# Patient Record
Sex: Male | Born: 1975 | Race: Black or African American | Hispanic: No | Marital: Married | Smoking: Current every day smoker
Health system: Southern US, Community
[De-identification: ages and names within clinical notes are randomized; demographics above are authoritative.]

## PROBLEM LIST (undated history)

## (undated) DIAGNOSIS — F32A Depression, unspecified: Secondary | ICD-10-CM

## (undated) DIAGNOSIS — I10 Essential (primary) hypertension: Secondary | ICD-10-CM

## (undated) DIAGNOSIS — M810 Age-related osteoporosis without current pathological fracture: Secondary | ICD-10-CM

## (undated) DIAGNOSIS — N289 Disorder of kidney and ureter, unspecified: Secondary | ICD-10-CM

## (undated) DIAGNOSIS — N19 Unspecified kidney failure: Secondary | ICD-10-CM

## (undated) DIAGNOSIS — K219 Gastro-esophageal reflux disease without esophagitis: Secondary | ICD-10-CM

## (undated) DIAGNOSIS — F329 Major depressive disorder, single episode, unspecified: Secondary | ICD-10-CM

## (undated) HISTORY — PX: NEPHRECTOMY TRANSPLANTED ORGAN: SUR880

## (undated) HISTORY — PX: COMBINED KIDNEY-PANCREAS TRANSPLANT: SHX1382

---

## 2008-03-30 ENCOUNTER — Inpatient Hospital Stay (HOSPITAL_COMMUNITY): Admission: EM | Admit: 2008-03-30 | Discharge: 2008-04-01 | Payer: Self-pay | Admitting: Internal Medicine

## 2008-03-30 ENCOUNTER — Ambulatory Visit: Payer: Self-pay | Admitting: Diagnostic Radiology

## 2008-03-30 ENCOUNTER — Encounter: Payer: Self-pay | Admitting: Emergency Medicine

## 2008-06-23 ENCOUNTER — Emergency Department (HOSPITAL_BASED_OUTPATIENT_CLINIC_OR_DEPARTMENT_OTHER): Admission: EM | Admit: 2008-06-23 | Discharge: 2008-06-23 | Payer: Self-pay | Admitting: Emergency Medicine

## 2008-08-25 ENCOUNTER — Emergency Department (HOSPITAL_BASED_OUTPATIENT_CLINIC_OR_DEPARTMENT_OTHER): Admission: EM | Admit: 2008-08-25 | Discharge: 2008-08-25 | Payer: Self-pay | Admitting: Emergency Medicine

## 2008-11-07 ENCOUNTER — Emergency Department (HOSPITAL_BASED_OUTPATIENT_CLINIC_OR_DEPARTMENT_OTHER): Admission: EM | Admit: 2008-11-07 | Discharge: 2008-11-07 | Payer: Self-pay | Admitting: Emergency Medicine

## 2008-11-07 ENCOUNTER — Ambulatory Visit: Payer: Self-pay | Admitting: Diagnostic Radiology

## 2009-02-18 ENCOUNTER — Ambulatory Visit: Payer: Self-pay | Admitting: Diagnostic Radiology

## 2009-02-18 ENCOUNTER — Emergency Department (HOSPITAL_BASED_OUTPATIENT_CLINIC_OR_DEPARTMENT_OTHER): Admission: EM | Admit: 2009-02-18 | Discharge: 2009-02-18 | Payer: Self-pay | Admitting: Emergency Medicine

## 2009-09-13 ENCOUNTER — Emergency Department (HOSPITAL_BASED_OUTPATIENT_CLINIC_OR_DEPARTMENT_OTHER): Admission: EM | Admit: 2009-09-13 | Discharge: 2009-09-13 | Payer: Self-pay | Admitting: Emergency Medicine

## 2009-09-13 ENCOUNTER — Ambulatory Visit: Payer: Self-pay | Admitting: Diagnostic Radiology

## 2010-04-25 LAB — URINALYSIS, ROUTINE W REFLEX MICROSCOPIC
Glucose, UA: NEGATIVE mg/dL
Ketones, ur: NEGATIVE mg/dL
Nitrite: NEGATIVE
Protein, ur: NEGATIVE mg/dL
pH: 5.5 (ref 5.0–8.0)

## 2010-04-25 LAB — DIFFERENTIAL
Basophils Absolute: 0.1 10*3/uL (ref 0.0–0.1)
Basophils Relative: 1 % (ref 0–1)
Lymphocytes Relative: 6 % — ABNORMAL LOW (ref 12–46)
Monocytes Absolute: 1.8 10*3/uL — ABNORMAL HIGH (ref 0.1–1.0)
Monocytes Relative: 11 % (ref 3–12)
Neutro Abs: 14.1 10*3/uL — ABNORMAL HIGH (ref 1.7–7.7)
Neutrophils Relative %: 81 % — ABNORMAL HIGH (ref 43–77)

## 2010-04-25 LAB — BASIC METABOLIC PANEL
CO2: 23 mEq/L (ref 19–32)
Chloride: 110 mEq/L (ref 96–112)
Creatinine, Ser: 1.6 mg/dL — ABNORMAL HIGH (ref 0.4–1.5)
GFR calc Af Amer: >60 mL/min (ref >60)
Potassium: 5 mEq/L (ref 3.5–5.1)

## 2010-04-25 LAB — CBC
HCT: 33 % — ABNORMAL LOW (ref 39.0–52.0)
Hemoglobin: 11 g/dL — ABNORMAL LOW (ref 13.0–17.0)
WBC: 17.4 10*3/uL — ABNORMAL HIGH (ref 4.0–10.5)

## 2010-05-22 ENCOUNTER — Emergency Department (INDEPENDENT_AMBULATORY_CARE_PROVIDER_SITE_OTHER): Payer: Medicare Other

## 2010-05-22 ENCOUNTER — Emergency Department (HOSPITAL_BASED_OUTPATIENT_CLINIC_OR_DEPARTMENT_OTHER)
Admission: EM | Admit: 2010-05-22 | Discharge: 2010-05-22 | Disposition: A | Discharge status: Home / Self Care | Payer: Medicare Other | Attending: Emergency Medicine | Admitting: Emergency Medicine

## 2010-05-22 DIAGNOSIS — R079 Chest pain, unspecified: Secondary | ICD-10-CM | POA: Insufficient documentation

## 2010-05-22 DIAGNOSIS — IMO0002 Reserved for concepts with insufficient information to code with codable children: Secondary | ICD-10-CM | POA: Insufficient documentation

## 2010-05-22 DIAGNOSIS — Y92009 Unspecified place in unspecified non-institutional (private) residence as the place of occurrence of the external cause: Secondary | ICD-10-CM | POA: Insufficient documentation

## 2010-05-22 DIAGNOSIS — X500XXA Overexertion from strenuous movement or load, initial encounter: Secondary | ICD-10-CM | POA: Insufficient documentation

## 2010-05-22 DIAGNOSIS — I10 Essential (primary) hypertension: Secondary | ICD-10-CM | POA: Insufficient documentation

## 2010-05-22 DIAGNOSIS — Z79899 Other long term (current) drug therapy: Secondary | ICD-10-CM | POA: Insufficient documentation

## 2010-05-22 DIAGNOSIS — G8929 Other chronic pain: Secondary | ICD-10-CM | POA: Insufficient documentation

## 2010-05-25 LAB — CBC
HCT: 36.1 % — ABNORMAL LOW (ref 39.0–52.0)
Hemoglobin: 10.7 g/dL — ABNORMAL LOW (ref 13.0–17.0)
Hemoglobin: 11.8 g/dL — ABNORMAL LOW (ref 13.0–17.0)
MCHC: 32.6 g/dL (ref 30.0–36.0)
RBC: 3.86 MIL/uL — ABNORMAL LOW (ref 4.22–5.81)
RDW: 14.6 % (ref 11.5–15.5)

## 2010-05-25 LAB — BASIC METABOLIC PANEL
BUN: 23 mg/dL (ref 6–23)
CO2: 19 mEq/L (ref 19–32)
Calcium: 7.8 mg/dL — ABNORMAL LOW (ref 8.4–10.5)
Chloride: 111 mEq/L (ref 96–112)
GFR calc Af Amer: >60 mL/min (ref >60)
GFR calc non Af Amer: 48 mL/min — ABNORMAL LOW (ref >60)
GFR calc non Af Amer: 58 mL/min — ABNORMAL LOW (ref >60)
Glucose, Bld: 111 mg/dL — ABNORMAL HIGH (ref 70–99)
Glucose, Bld: 87 mg/dL (ref 70–99)
Potassium: 4.2 mEq/L (ref 3.5–5.1)
Sodium: 136 mEq/L (ref 135–145)
Sodium: 138 mEq/L (ref 135–145)

## 2010-05-25 LAB — URINALYSIS, ROUTINE W REFLEX MICROSCOPIC
Bilirubin Urine: NEGATIVE
Nitrite: NEGATIVE
Specific Gravity, Urine: 1.022 (ref 1.005–1.030)
pH: 5 (ref 5.0–8.0)

## 2010-05-25 LAB — DIFFERENTIAL
Basophils Absolute: 0 10*3/uL (ref 0.0–0.1)
Basophils Absolute: 0.1 10*3/uL (ref 0.0–0.1)
Basophils Relative: 0 % (ref 0–1)
Basophils Relative: 1 % (ref 0–1)
Eosinophils Absolute: 0.2 10*3/uL (ref 0.0–0.7)
Eosinophils Relative: 1 % (ref 0–5)
Lymphocytes Relative: 1 % — ABNORMAL LOW (ref 12–46)
Lymphocytes Relative: 11 % — ABNORMAL LOW (ref 12–46)
Lymphs Abs: 0.2 10*3/uL — ABNORMAL LOW (ref 0.7–4.0)
Monocytes Absolute: 1 10*3/uL (ref 0.1–1.0)
Monocytes Absolute: 1.2 10*3/uL — ABNORMAL HIGH (ref 0.1–1.0)
Monocytes Relative: 15 % — ABNORMAL HIGH (ref 3–12)
Monocytes Relative: 9 % (ref 3–12)
Neutro Abs: 11.9 10*3/uL — ABNORMAL HIGH (ref 1.7–7.7)
Neutro Abs: 4.7 10*3/uL (ref 1.7–7.7)
Neutro Abs: 7.1 10*3/uL (ref 1.7–7.7)
Neutrophils Relative %: 86 % — ABNORMAL HIGH (ref 43–77)
Neutrophils Relative %: 88 % — ABNORMAL HIGH (ref 43–77)

## 2010-05-25 LAB — COMPREHENSIVE METABOLIC PANEL
BUN: 39 mg/dL — ABNORMAL HIGH (ref 6–23)
CO2: 24 mEq/L (ref 19–32)
Chloride: 108 mEq/L (ref 96–112)
Creatinine, Ser: 1.7 mg/dL — ABNORMAL HIGH (ref 0.4–1.5)
GFR calc non Af Amer: 47 mL/min — ABNORMAL LOW (ref >60)
Glucose, Bld: 170 mg/dL — ABNORMAL HIGH (ref 70–99)
Total Bilirubin: 0.7 mg/dL (ref 0.3–1.2)

## 2010-05-25 LAB — CULTURE, BLOOD (ROUTINE X 2)
Culture: NO GROWTH
Culture: NO GROWTH

## 2010-05-25 LAB — URINE MICROSCOPIC-ADD ON

## 2010-05-25 LAB — CLOSTRIDIUM DIFFICILE EIA

## 2010-05-25 LAB — STOOL CULTURE

## 2010-06-22 NOTE — H&P (Signed)
[name], [name]                   ACCOUNT NO.:  [id number]   MEDICAL RECORD NO.:  [id number]          PATIENT TYPE:  INP   LOCATION:  1523                         FACILITY:  [facility]   PHYSICIAN:  Della Goo, M.D. DATE OF BIRTH:  March 14, 1975   DATE OF ADMISSION:  03/31/2008  DATE OF DISCHARGE:  02/06/2008                              HISTORY & PHYSICAL   PRIMARY CARE PHYSICIAN:  Dr. Terie Purser, [place], [place].   CHIEF COMPLAINTS:  Nausea, vomiting and diarrhea.   HISTORY OF PRESENT ILLNESS:  This is a 35 year old male who presents to  the [facility] emergency department for evaluation  secondary to complaints of severe nausea, vomiting and diarrhea that  started in the a.m.  The patient reports these symptoms started all of a  sudden.  He denied having any fevers and chills at that time.  However,  when he did present to the emergency department he was found to have a  temperature of 103.2 rectally.  The patient denies having any  hematemesis, hematochezia or melena passage.  He denies that anyone else  is sick at home.  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   PAST MEDICAL HISTORY:  Otherwise:  1. History of renal failure in the past.  2. Pancreatitis in the past.  3. Previous type 1 diabetes, but no longer since his transplant.  4. Hypertension.   MEDICATIONS:  1. Labetalol 300 mg p.o. q.12 h.  2. Prednisone 7.5 mg one p.o. daily.  3. CellCept 250 mg three tablets p.o. b.i.d.  4. Tacrolimus 1 mg four tablets p.o. b.i.d.  5. Protonix 40 mg one p.o. daily.  6. Chantix 1 mg one p.o. b.i.d.   ALLERGIES:  AMOXICILLIN.   SOCIAL HISTORY:  The patient reports not smoking cigarettes since  March 12, 2008 and being on Chantix therapy.  He denies any alcohol  usage or any illicit drug usage.   FAMILY HISTORY:   Noncontributory.   REVIEW OF SYSTEMS:  Pertinents are mentioned above.   PHYSICAL EXAMINATION FINDINGS:  This is a 35 year old well-nourished,  well-developed male, currently in no acute distress.  VITAL SIGNS:  Temperature currently 101.7, blood pressure 111/73, heart  rate 99-111, respirations 24, but now 18; and O2 saturations 100% on  room air.  HEENT:  Normocephalic, atraumatic.  There is no scleral icterus.  Pupils  are equally round and reactive to light.  Extraocular movements are  intact.  Funduscopic benign.  Oropharynx is clear.  NECK:  Supple.  Full range of motion.  No thyromegaly, adenopathy or  jugular venous distention.  CARDIOVASCULAR:  Regular rate and rhythm.  No murmurs, gallops or rubs.  LUNGS:  Clear to auscultation bilaterally.  ABDOMEN:  Positive bowel sounds.  Soft, nontender, nondistended.  No  hepatosplenomegaly.  No rebound or guarding.  EXTREMITIES: Without cyanosis, clubbing or edema.  NEUROLOGIC EXAMINATION:  Alert and oriented x3.  There are no focal  deficits.  LABORATORY STUDIES:  White blood cell count 13.6, hemoglobin 14.7,  hematocrit 46.6, platelets 279, neutrophils 88% lymphocytes 1%.  Sodium  142, potassium 5.3, chloride 108, carbon dioxide 24, BUN 39, creatinine  1.7, glucose 170, lipase 59.  Urinalysis negative.  Chest x-ray:  No  acute disease process.   ASSESSMENT:  A 35 year old male being admitted with:  1. Acute gastroenteritis.  2. Mild dehydration.  3. Mild hyperglycemia.  4. History of renal and pancreatic transplant.   PLAN:  The patient will be admitted and placed on IV fluids for fluid  resuscitation.  The patient will be placed on antiemetic therapy as  needed for his symptoms.  Stool studies have been ordered to check for  culture, sensitivity and for C. difficile toxin; however at this time,  the patient probably has the winter GI virus; which is endemic at this  time.  The patient's regular medications will be continued,  and the  patient will be placed on DVT and GI prophylaxis as well.  Further  workup will ensue pending results of the patient's clinical course and  results of his studies.      Della Goo, M.D.  Electronically Signed     HJ/MEDQ  D:  03/31/2008  T:  03/31/2008  Job:  [id number]

## 2010-06-22 NOTE — Discharge Summary (Signed)
[name], [name]                   ACCOUNT NO.:  [id number]   MEDICAL RECORD NO.:  [id number]          PATIENT TYPE:  INP   LOCATION:  1523                         FACILITY:  [facility]   PHYSICIAN:  Monte Fantasia, MD  DATE OF BIRTH:  February 12, 1975   DATE OF ADMISSION:  03/30/2008  DATE OF DISCHARGE:  04/01/2008                               DISCHARGE SUMMARY   PRIMARY CARE PHYSICIAN:  Dr. Terie Purser in [place], [place].   DISCHARGE DIAGNOSES:  1. Acute gastroenteritis.  2. Mild dehydration.  3. History of renal and pancreatic transplant.  4. Hyperglycemia.   MEDICATIONS UPON DISCHARGE:  1. Labetalol 300 mg p.o. every 12 hours.  2. Prednisone 7.5 mg p.o. daily with food.  3. Tacrolimus 1 mg 4 tablets twice daily.  4. CellCept 250 mg 3 tablets twice daily.  5. Protonix 40 mg p.o. daily.  6. Chantix 1 mg p.o. b.i.d.   COURSE DURING THE HOSPITAL STAY:  A 35 year old Caucasian male patient  presented to [facility] Emergency Department for complaints of  nausea, vomiting, and diarrhea this a.m. 1 day back.  Patient stated  that the symptoms started all of a sudden.  Denied any complaints of  fever or chills at that time.  In the emergency department, patient was  found to have temperature of 103.2 rectally.  Patient does have history  of pancreatic and renal transplant performed in 2004 at [place] of  [place].  Patient was admitted in view of acute gastroenteritis.  On  admission, the creatinine was 1.7.  Patient was given IV fluid hydration  for the same.  Stool studies were sent.  C. diff toxins were negative.  Patient has tolerated p.o. intake well today and denies any episodes of  diarrhea.  At present, patient is stable to be discharged and can be  discharged home.  Patient is recommended to follow up with his primary  care physician in 1 week's time.   RADIOLOGICAL INVESTIGATIONS DONE DURING THE STAY IN THE HOSPITAL:  Chest  x-ray done on March 30, 2008.   Impression:  No active cardiopulmonary  disease.   LABS DONE DURING THE STAY IN THE HOSPITAL:  Total WBC 6.6, hemoglobin  10.7, hematocrit 33.0, platelets of 185, sodium 138, potassium 3.9,  chloride 114, bicarb 20, glucose 87, BUN 23, creatinine 1.4, total  bilirubin 0.7, alkaline phosphatase 63, AST is 38, ALT is 18, total  protein 8.5, albumin 5.0, calcium of 10.4.  UA has been negative.  Stool  C. diff toxin was negative.  Blood cultures have been no growth to date  and patient improved well.   DISPOSITION:  Patient is at present stable to be discharged today has  improved well with IV fluid hydration and is recommended to follow up  with the primary care physician.      Monte Fantasia, MD  Electronically Signed     MP/MEDQ  D:  04/01/2008  T:  04/01/2008  Job:  [id number]   cc:   Dr. Tina Griffiths, [place]

## 2010-12-22 DIAGNOSIS — E109 Type 1 diabetes mellitus without complications: Secondary | ICD-10-CM | POA: Insufficient documentation

## 2010-12-22 DIAGNOSIS — Z79899 Other long term (current) drug therapy: Secondary | ICD-10-CM | POA: Insufficient documentation

## 2010-12-22 DIAGNOSIS — I10 Essential (primary) hypertension: Secondary | ICD-10-CM | POA: Insufficient documentation

## 2010-12-22 DIAGNOSIS — Z940 Kidney transplant status: Secondary | ICD-10-CM | POA: Insufficient documentation

## 2010-12-22 DIAGNOSIS — Z9483 Pancreas transplant status: Secondary | ICD-10-CM | POA: Insufficient documentation

## 2010-12-22 DIAGNOSIS — D631 Anemia in chronic kidney disease: Secondary | ICD-10-CM | POA: Insufficient documentation

## 2011-08-30 ENCOUNTER — Encounter (HOSPITAL_BASED_OUTPATIENT_CLINIC_OR_DEPARTMENT_OTHER): Payer: Self-pay | Admitting: *Deleted

## 2011-08-30 ENCOUNTER — Emergency Department (HOSPITAL_BASED_OUTPATIENT_CLINIC_OR_DEPARTMENT_OTHER)
Admission: EM | Admit: 2011-08-30 | Discharge: 2011-08-30 | Disposition: A | Discharge status: Home / Self Care | Payer: Medicare Other | Attending: Emergency Medicine | Admitting: Emergency Medicine

## 2011-08-30 ENCOUNTER — Emergency Department (HOSPITAL_BASED_OUTPATIENT_CLINIC_OR_DEPARTMENT_OTHER): Payer: Medicare Other

## 2011-08-30 DIAGNOSIS — Y9354 Activity, bowling: Secondary | ICD-10-CM | POA: Insufficient documentation

## 2011-08-30 DIAGNOSIS — I10 Essential (primary) hypertension: Secondary | ICD-10-CM | POA: Insufficient documentation

## 2011-08-30 DIAGNOSIS — S93609A Unspecified sprain of unspecified foot, initial encounter: Secondary | ICD-10-CM | POA: Insufficient documentation

## 2011-08-30 DIAGNOSIS — M79609 Pain in unspecified limb: Secondary | ICD-10-CM | POA: Insufficient documentation

## 2011-08-30 DIAGNOSIS — F17200 Nicotine dependence, unspecified, uncomplicated: Secondary | ICD-10-CM | POA: Insufficient documentation

## 2011-08-30 DIAGNOSIS — K219 Gastro-esophageal reflux disease without esophagitis: Secondary | ICD-10-CM | POA: Insufficient documentation

## 2011-08-30 DIAGNOSIS — F3289 Other specified depressive episodes: Secondary | ICD-10-CM | POA: Insufficient documentation

## 2011-08-30 DIAGNOSIS — E119 Type 2 diabetes mellitus without complications: Secondary | ICD-10-CM | POA: Insufficient documentation

## 2011-08-30 DIAGNOSIS — X58XXXA Exposure to other specified factors, initial encounter: Secondary | ICD-10-CM | POA: Insufficient documentation

## 2011-08-30 DIAGNOSIS — F329 Major depressive disorder, single episode, unspecified: Secondary | ICD-10-CM | POA: Insufficient documentation

## 2011-08-30 DIAGNOSIS — Z79899 Other long term (current) drug therapy: Secondary | ICD-10-CM | POA: Insufficient documentation

## 2011-08-30 HISTORY — DX: Gastro-esophageal reflux disease without esophagitis: K21.9

## 2011-08-30 HISTORY — DX: Age-related osteoporosis without current pathological fracture: M81.0

## 2011-08-30 HISTORY — DX: Depression, unspecified: F32.A

## 2011-08-30 HISTORY — DX: Major depressive disorder, single episode, unspecified: F32.9

## 2011-08-30 HISTORY — DX: Disorder of kidney and ureter, unspecified: N28.9

## 2011-08-30 HISTORY — DX: Essential (primary) hypertension: I10

## 2011-08-30 NOTE — ED Provider Notes (Signed)
Medical screening examination/treatment/procedure(s) were performed by non-physician practitioner and as supervising physician I was immediately available for consultation/collaboration.  Cyndra Numbers, MD 08/30/11 [phone]

## 2011-08-30 NOTE — ED Notes (Signed)
pain in his right foot x 1 week. Hx of osteoporosis.  No known injury.

## 2011-08-30 NOTE — ED Provider Notes (Signed)
History     CSN: [id number]  Arrival date & time 08/30/11  1801   First MD Initiated Contact with Patient 08/30/11 1837      Chief Complaint  Patient presents with  . Foot Pain    (Consider location/radiation/quality/duration/timing/severity/associated sxs/prior treatment) HPI Comments: 36 y/o male presents with right foot pain x 1 week. States he was bowling 1 week ago and believes he may have stepped on it the wrong way. Pain increased over the past week due to working on his feet all week. Pain described as throbbing 6/10 at rest and sharp 10/10 with any pressure. Has not tried any alleviating factors. He noticed some swelling over the past few days. Admits he is osteopenic due to chronic steroid use post kidney and pancreas transplant. He does take a daily calcium supplement. Denies any ankle pain, numbness or tingling in foot.  Patient is a 36 y.o. male presenting with lower extremity pain. The history is provided by the patient and a significant other.  Foot Pain Pertinent negatives include no numbness.    Past Medical History  Diagnosis Date  . Osteoporosis   . Renal disorder   . Diabetes mellitus   . Hypertension   . GERD (gastroesophageal reflux disease)   . Depression     Past Surgical History  Procedure Date  . Nephrectomy transplanted organ   . Combined kidney-pancreas transplant     No family history on file.  History  Substance Use Topics  . Smoking status: Current Everyday Smoker -- 0.5 packs/day  . Smokeless tobacco: Not on file  . Alcohol Use: No      Review of Systems  Musculoskeletal:       Positive right foot pain and swelling  Skin: Negative for color change.  Neurological: Negative for numbness.    Allergies  Review of patient's allergies indicates no known allergies.  Home Medications   Current Outpatient Rx  Name Route Sig Dispense Refill  . ALBUTEROL SULFATE HFA 108 (90 BASE) MCG/ACT IN AERS Inhalation Inhale 2 puffs into the  lungs every 4 (four) hours as needed. For shortness of breath    . CALCIUM-VITAMIN D PO Oral Take 1 tablet by mouth daily.    . OMEGA-3 FATTY ACIDS 1000 MG PO CAPS Oral Take 1 g by mouth daily.    Marland Kitchen HAWTHORN PO Oral Take 1 tablet by mouth daily.    Marland Kitchen LABETALOL HCL 200 MG PO TABS Oral Take 200 mg by mouth 2 (two) times daily.    Marland Kitchen MILK THISTLE PO Oral Take 1 tablet by mouth daily.    . ADULT MULTIVITAMIN W/MINERALS CH Oral Take 1 tablet by mouth daily.    Marland Kitchen MYCOPHENOLATE MOFETIL 250 MG PO CAPS Oral Take 750 mg by mouth 2 (two) times daily.    Marland Kitchen OMEPRAZOLE 40 MG PO CPDR Oral Take 40 mg by mouth 2 (two) times daily.    Marland Kitchen PREDNISONE (PAK) 5 MG PO TABS Oral Take 7.5 mg by mouth daily.    . SULFAMETHOXAZOLE-TMP DS 800-160 MG PO TABS Oral Take 1 tablet by mouth 2 (two) times a week. On Mon and Thurs    . TACROLIMUS 1 MG PO CAPS Oral Take 3 mg by mouth 2 (two) times daily.      BP 141/76  Pulse 72  Temp 98.2 F (36.8 C) (Oral)  Resp 20  SpO2 99%  Physical Exam  Constitutional: He is oriented to person, place, and time. He appears well-developed and well-nourished.  No distress.  HENT:  Head: Normocephalic and atraumatic.  Eyes: Conjunctivae are normal.  Cardiovascular: Normal rate, regular rhythm, normal heart sounds and intact distal pulses.   Pulmonary/Chest: Effort normal and breath sounds normal.  Musculoskeletal:       Right ankle: Normal. Achilles tendon normal.       Right foot: He exhibits decreased range of motion, tenderness (over 5th metatarsal most prominent at the base), bony tenderness and swelling (over lateral aspect of right foot). He exhibits normal capillary refill and no deformity.  Neurological: He is alert and oriented to person, place, and time. No sensory deficit.  Skin: Skin is warm.  Psychiatric: He has a normal mood and affect. His behavior is normal.    ED Course  Procedures (including critical care time)  Labs Reviewed - No data to display Dg Foot Complete  Right  08/30/2011  *RADIOLOGY REPORT*  Clinical Data: Right foot pain along the fourth and fifth metatarsals diabetes, renal failure  RIGHT FOOT COMPLETE - 3+ VIEW  Comparison: None.  Findings: Bones appear osteopenic for the patient age.  Peripheral vascular calcifications noted throughout the foot.  Normal alignment.  No fracture evident.  No evidence of focal bone loss, bony destruction, periostitis.  No significant arthropathy or degenerative process.  No radiopaque foreign body.  IMPRESSION: No acute osseous finding.  Peripheral atherosclerosis  Original Report Authenticated By: Judie Petit. Ruel Favors, M.D.     1. Foot sprain       MDM  36 y/o male with 1 week of foot pain s/p bowling. xrays negative for acute fracture. No evidence of neurovascular compromise. Conservative tx discussed- RICE. Post-op shoe and crutches given. Patient states he has 800 mg ibuprofen at home and will take that. Follow up with ortho.        Trevor Mace, PA-C 08/30/11 1922

## 2011-08-30 NOTE — ED Notes (Signed)
Pt stated that he went bowling a week ago and ever since his foot has hurt and been swollen. Pulses are present bilaterally, +2. Pt is ambulatory with steady gait. NAD noted.

## 2011-11-11 ENCOUNTER — Emergency Department (HOSPITAL_BASED_OUTPATIENT_CLINIC_OR_DEPARTMENT_OTHER)
Admission: EM | Admit: 2011-11-11 | Discharge: 2011-11-11 | Disposition: A | Discharge status: Home / Self Care | Payer: No Typology Code available for payment source | Attending: Emergency Medicine | Admitting: Emergency Medicine

## 2011-11-11 ENCOUNTER — Emergency Department (HOSPITAL_BASED_OUTPATIENT_CLINIC_OR_DEPARTMENT_OTHER): Payer: No Typology Code available for payment source

## 2011-11-11 ENCOUNTER — Encounter (HOSPITAL_BASED_OUTPATIENT_CLINIC_OR_DEPARTMENT_OTHER): Payer: Self-pay | Admitting: *Deleted

## 2011-11-11 DIAGNOSIS — N289 Disorder of kidney and ureter, unspecified: Secondary | ICD-10-CM | POA: Insufficient documentation

## 2011-11-11 DIAGNOSIS — K219 Gastro-esophageal reflux disease without esophagitis: Secondary | ICD-10-CM | POA: Insufficient documentation

## 2011-11-11 DIAGNOSIS — Z043 Encounter for examination and observation following other accident: Secondary | ICD-10-CM | POA: Insufficient documentation

## 2011-11-11 DIAGNOSIS — M810 Age-related osteoporosis without current pathological fracture: Secondary | ICD-10-CM | POA: Insufficient documentation

## 2011-11-11 DIAGNOSIS — E119 Type 2 diabetes mellitus without complications: Secondary | ICD-10-CM | POA: Insufficient documentation

## 2011-11-11 DIAGNOSIS — M542 Cervicalgia: Secondary | ICD-10-CM

## 2011-11-11 DIAGNOSIS — I10 Essential (primary) hypertension: Secondary | ICD-10-CM | POA: Insufficient documentation

## 2011-11-11 DIAGNOSIS — R51 Headache: Secondary | ICD-10-CM

## 2011-11-11 DIAGNOSIS — F17200 Nicotine dependence, unspecified, uncomplicated: Secondary | ICD-10-CM | POA: Insufficient documentation

## 2011-11-11 NOTE — ED Notes (Signed)
MD at bedside. 
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
## 2011-11-11 NOTE — ED Notes (Signed)
Patient was brought to Cat Scan.  Pt had large earring in left ear.  When I advised pt that earring needed to come out Pt stated "it doesn't come out"  I advised pt that I could not do the Cat scan of his neck because of that earring.  Pt turned around and walked back to the ED.  Pt advised Dr Rosalia Hammers that no one offered to help him take his earring out.  When I went to get something to help remove his earring the patient talked to Dr Rosalia Hammers and left AMA.

## 2011-11-11 NOTE — ED Notes (Signed)
Dr ray into room , pt upset about the wait time and unable to get earring out for ct scan, pt took c collor off and told MD he was leaving

## 2011-11-13 ENCOUNTER — Emergency Department (HOSPITAL_BASED_OUTPATIENT_CLINIC_OR_DEPARTMENT_OTHER)
Admission: EM | Admit: 2011-11-13 | Discharge: 2011-11-13 | Disposition: A | Discharge status: Home / Self Care | Payer: No Typology Code available for payment source | Attending: Emergency Medicine | Admitting: Emergency Medicine

## 2011-11-13 ENCOUNTER — Encounter (HOSPITAL_BASED_OUTPATIENT_CLINIC_OR_DEPARTMENT_OTHER): Payer: Self-pay | Admitting: *Deleted

## 2011-11-13 DIAGNOSIS — M542 Cervicalgia: Secondary | ICD-10-CM | POA: Insufficient documentation

## 2011-11-13 DIAGNOSIS — R51 Headache: Secondary | ICD-10-CM | POA: Insufficient documentation
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
## 2011-11-19 ENCOUNTER — Emergency Department (HOSPITAL_BASED_OUTPATIENT_CLINIC_OR_DEPARTMENT_OTHER): Payer: No Typology Code available for payment source

## 2011-11-19 ENCOUNTER — Encounter (HOSPITAL_BASED_OUTPATIENT_CLINIC_OR_DEPARTMENT_OTHER): Payer: Self-pay | Admitting: Emergency Medicine

## 2011-11-19 ENCOUNTER — Emergency Department (HOSPITAL_BASED_OUTPATIENT_CLINIC_OR_DEPARTMENT_OTHER)
Admission: EM | Admit: 2011-11-19 | Discharge: 2011-11-19 | Disposition: A | Discharge status: Home / Self Care | Payer: No Typology Code available for payment source | Attending: Emergency Medicine | Admitting: Emergency Medicine

## 2011-11-19 DIAGNOSIS — Z79899 Other long term (current) drug therapy: Secondary | ICD-10-CM | POA: Insufficient documentation

## 2011-11-19 DIAGNOSIS — S139XXA Sprain of joints and ligaments of unspecified parts of neck, initial encounter: Secondary | ICD-10-CM | POA: Insufficient documentation

## 2011-11-19 DIAGNOSIS — I10 Essential (primary) hypertension: Secondary | ICD-10-CM | POA: Insufficient documentation

## 2011-11-19 DIAGNOSIS — E119 Type 2 diabetes mellitus without complications: Secondary | ICD-10-CM | POA: Insufficient documentation

## 2011-11-19 DIAGNOSIS — M542 Cervicalgia: Secondary | ICD-10-CM | POA: Insufficient documentation

## 2011-11-19 DIAGNOSIS — Y92410 Unspecified street and highway as the place of occurrence of the external cause: Secondary | ICD-10-CM | POA: Insufficient documentation

## 2011-11-19 DIAGNOSIS — R51 Headache: Secondary | ICD-10-CM | POA: Insufficient documentation

## 2011-11-19 DIAGNOSIS — S161XXA Strain of muscle, fascia and tendon at neck level, initial encounter: Secondary | ICD-10-CM

## 2011-11-19 MED ORDER — CYCLOBENZAPRINE HCL 10 MG PO TABS: 10.0000 mg | ORAL_TABLET | Freq: Three times a day (TID) | ORAL | Status: DC | PRN
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
## 2011-11-23 NOTE — ED Provider Notes (Signed)
Medical screening examination/treatment/procedure(s) were performed by non-physician practitioner and as supervising physician I was immediately available for consultation/collaboration.    Daisi Kentner L Juna Caban, MD 11/23/11 1154 

## 2013-10-21 ENCOUNTER — Emergency Department (HOSPITAL_BASED_OUTPATIENT_CLINIC_OR_DEPARTMENT_OTHER)
Admission: EM | Admit: 2013-10-21 | Discharge: 2013-10-21 | Disposition: A | Discharge status: Home / Self Care | Payer: Medicare Other | Attending: Emergency Medicine | Admitting: Emergency Medicine

## 2013-10-21 ENCOUNTER — Encounter (HOSPITAL_BASED_OUTPATIENT_CLINIC_OR_DEPARTMENT_OTHER): Payer: Self-pay | Admitting: Emergency Medicine

## 2013-10-21 DIAGNOSIS — Z87891 Personal history of nicotine dependence: Secondary | ICD-10-CM | POA: Diagnosis not present

## 2013-10-21 DIAGNOSIS — M545 Low back pain, unspecified: Secondary | ICD-10-CM | POA: Diagnosis not present

## 2013-10-21 DIAGNOSIS — M5380 Other specified dorsopathies, site unspecified: Secondary | ICD-10-CM | POA: Insufficient documentation

## 2013-10-21 DIAGNOSIS — Z87448 Personal history of other diseases of urinary system: Secondary | ICD-10-CM | POA: Diagnosis not present

## 2013-10-21 DIAGNOSIS — E119 Type 2 diabetes mellitus without complications: Secondary | ICD-10-CM | POA: Diagnosis not present

## 2013-10-21 DIAGNOSIS — M810 Age-related osteoporosis without current pathological fracture: Secondary | ICD-10-CM | POA: Diagnosis not present

## 2013-10-21 DIAGNOSIS — Z79899 Other long term (current) drug therapy: Secondary | ICD-10-CM | POA: Diagnosis not present

## 2013-10-21 DIAGNOSIS — Z791 Long term (current) use of non-steroidal anti-inflammatories (NSAID): Secondary | ICD-10-CM | POA: Insufficient documentation

## 2013-10-21 DIAGNOSIS — Z8719 Personal history of other diseases of the digestive system: Secondary | ICD-10-CM | POA: Diagnosis not present

## 2013-10-21 DIAGNOSIS — Z880 Allergy status to penicillin: Secondary | ICD-10-CM | POA: Insufficient documentation

## 2013-10-21 DIAGNOSIS — I10 Essential (primary) hypertension: Secondary | ICD-10-CM | POA: Insufficient documentation

## 2013-10-21 DIAGNOSIS — Z8659 Personal history of other mental and behavioral disorders: Secondary | ICD-10-CM | POA: Diagnosis not present

## 2013-10-21 DIAGNOSIS — M62830 Muscle spasm of back: Secondary | ICD-10-CM

## 2013-10-21 DIAGNOSIS — IMO0002 Reserved for concepts with insufficient information to code with codable children: Secondary | ICD-10-CM | POA: Insufficient documentation

## 2013-10-21 MED ORDER — HYDROCODONE-ACETAMINOPHEN 5-325 MG PO TABS: 1.0000 | ORAL_TABLET | Freq: Four times a day (QID) | ORAL | Status: DC | PRN

## 2013-10-21 MED ORDER — CYCLOBENZAPRINE HCL 10 MG PO TABS: 10.0000 mg | ORAL_TABLET | Freq: Three times a day (TID) | ORAL | Status: DC | PRN

## 2013-10-21 MED ORDER — NAPROXEN 500 MG PO TABS: 500.0000 mg | ORAL_TABLET | Freq: Two times a day (BID) | ORAL | Status: DC | PRN

## 2013-10-21 NOTE — ED Provider Notes (Signed)
CSN: [id number]  Arrival date & time 10/21/13  1619 History   First MD Initiated Contact with Patient 10/21/13 1624     Chief Complaint  Patient presents with  . Back Pain     (Consider location/radiation/quality/duration/timing/severity/associated sxs/prior Treatment) HPI Comments: [name] is a 38 y.o. male with a PMHx of osteoporosis, kidney transplant, DM, HTN, GERD, and depression, presenting to the ED today with complaints of lumbar back pain that began gradually 2 weeks ago after visiting a water park, as well as installing hardwood floors. His pain at 8/10, describes it as throbbing, constant, nonradiating, worse with standing or bending and after long periods of activity, and improved with heat. He has not tried any medications for pain, has used hydrocodone with in the past. He does endorse chronic back pain, but states this is slightly different than his chronic back pain. Denies any fevers, cough, chest pain, shortness of breath, abdominal pain, nausea, vomiting, diarrhea, incontinence of urine or stool, perianal numbness or tingling, lower extremity weakness or numbness, dysuria, hematuria, or cauda equina symptoms. No recent trauma or injury to his back.  Patient is a 38 y.o. male presenting with back pain. The history is provided by the patient. No language interpreter was used.  Back Pain Location:  Lumbar spine Quality:  Cramping (throbbing) Radiates to:  Does not radiate Pain severity:  Moderate (8/10) Onset quality:  Gradual Duration:  2 weeks Timing:  Constant Progression:  Unchanged Chronicity:  Recurrent Context: physical stress   Context comment:  Went to a water park 2 wks ago, and installing hard wood floors recently Relieved by:  Heating pad Worsened by:  Bending and standing Ineffective treatments:  None tried Associated symptoms: no abdominal pain, no bladder incontinence, no bowel incontinence, no chest pain, no dysuria, no fever, no headaches, no leg  pain, no numbness, no paresthesias, no pelvic pain, no perianal numbness, no tingling, no weakness and no weight loss   Risk factors: no hx of cancer     Past Medical History  Diagnosis Date  . Osteoporosis   . Renal disorder   . Diabetes mellitus   . Hypertension   . GERD (gastroesophageal reflux disease)   . Depression    Past Surgical History  Procedure Laterality Date  . Nephrectomy transplanted organ    . Combined kidney-pancreas transplant     No family history on file. History  Substance Use Topics  . Smoking status: Former Smoker -- 0.50 packs/day  . Smokeless tobacco: Not on file  . Alcohol Use: No    Review of Systems  Constitutional: Negative for fever, chills and weight loss.  Respiratory: Negative for cough and shortness of breath.   Cardiovascular: Negative for chest pain.  Gastrointestinal: Negative for nausea, vomiting, abdominal pain, diarrhea, constipation and bowel incontinence.  Genitourinary: Negative for bladder incontinence, dysuria, urgency, frequency, hematuria, flank pain, difficulty urinating and pelvic pain.  Musculoskeletal: Positive for back pain. Negative for arthralgias, gait problem, joint swelling, myalgias, neck pain and neck stiffness.  Skin: Negative for color change.  Neurological: Negative for dizziness, tingling, weakness, numbness, headaches and paresthesias.  10 Systems reviewed and are negative for acute change except as noted in the HPI.     Allergies  Penicillins  Home Medications   Prior to Admission medications   Medication Sig Start Date End Date Taking? Authorizing Provider  albuterol (PROVENTIL HFA;VENTOLIN HFA) 108 (90 BASE) MCG/ACT inhaler Inhale 2 puffs into the lungs every 4 (four) hours as  needed. For shortness of breath    Historical Provider, MD  CALCIUM-VITAMIN D PO Take 1 tablet by mouth daily.    Historical Provider, MD  cyclobenzaprine (FLEXERIL) 10 MG tablet Take 1 tablet (10 mg total) by mouth 3 (three)  times daily as needed for muscle spasms. 11/19/11   Kaitlyn Szekalski, PA-C  cyclobenzaprine (FLEXERIL) 10 MG tablet Take 1 tablet (10 mg total) by mouth 3 (three) times daily as needed for muscle spasms. 10/21/13   Asjah Rauda Strupp Camprubi-Soms, PA-C  esomeprazole (NEXIUM) 20 MG capsule Take 20 mg by mouth daily before breakfast.    Historical Provider, MD  fish oil-omega-3 fatty acids 1000 MG capsule Take 1 g by mouth daily.    Historical Provider, MD  HYDROcodone-acetaminophen (NORCO) 5-325 MG per tablet Take 1-2 tablets by mouth every 6 (six) hours as needed for severe pain. 10/21/13   Hussain Maimone Strupp Camprubi-Soms, PA-C  labetalol (NORMODYNE) 200 MG tablet Take 200 mg by mouth 2 (two) times daily.    Historical Provider, MD  MILK THISTLE PO Take 1 tablet by mouth daily.    Historical Provider, MD  Multiple Vitamin (MULTIVITAMIN WITH MINERALS) TABS Take 1 tablet by mouth daily.    Historical Provider, MD  mycophenolate (CELLCEPT) 250 MG capsule Take 750 mg by mouth 2 (two) times daily.    Historical Provider, MD  naproxen (NAPROSYN) 500 MG tablet Take 1 tablet (500 mg total) by mouth 2 (two) times daily as needed for mild pain, moderate pain or headache (TAKE WITH MEALS.). 10/21/13   Vaani Morren Strupp Camprubi-Soms, PA-C  predniSONE (STERAPRED UNI-PAK) 5 MG TABS Take 7.5 mg by mouth daily.    Historical Provider, MD  sulfamethoxazole-trimethoprim (BACTRIM DS) 800-160 MG per tablet Take 1 tablet by mouth 2 (two) times a week. On Mon and Thurs    Historical Provider, MD  tacrolimus (PROGRAF) 1 MG capsule Take 3 mg by mouth 2 (two) times daily.    Historical Provider, MD   BP 127/79  Pulse 67  Temp(Src) 98.2 F (36.8 C) (Oral)  Resp 14  Ht  (1.651 m)  Wt 130 lb (58.968 kg)  BMI 21.63 kg/m2  SpO2 100% Physical Exam  Nursing note and vitals reviewed. Constitutional: He is oriented to person, place, and time. Vital signs are normal. He appears well-developed and well-nourished. No distress.    Afebrile, NAD  HENT:  Head: Normocephalic and atraumatic.  Mouth/Throat: Mucous membranes are normal.  Eyes: Conjunctivae and EOM are normal. Right eye exhibits no discharge. Left eye exhibits no discharge.  Neck: Normal range of motion. Neck supple. No spinous process tenderness and no muscular tenderness present. No rigidity. Normal range of motion present.  FROM intact without spinous process or paraspinous muscle TTP, no bony stepoffs or deformities, no muscle spasms. No rigidity or meningeal signs. No bruising or swelling.  Cardiovascular: Normal rate and intact distal pulses.   Pulmonary/Chest: Effort normal. No respiratory distress.  Abdominal: Soft. Normal appearance. He exhibits no distension. There is no tenderness. There is no rigidity, no rebound, no guarding and no CVA tenderness.  Musculoskeletal: Normal range of motion.       Lumbar back: He exhibits pain and spasm. He exhibits normal range of motion, no bony tenderness, no swelling and no deformity.       Back:  Bilateral lumbar paraspinous muscle TTP and mild spasm noted, no midline TTP or bony deformities. Neg straight leg raise bilaterally. FROM intact in all spinal levels. Gait nonantalgic. Strength 5/5 in  all extremities. Sensation grossly intact in all extremities.   Neurological: He is alert and oriented to person, place, and time. He has normal strength. No sensory deficit. Gait normal.  Skin: Skin is warm, dry and intact. No rash noted.  Psychiatric: He has a normal mood and affect.    ED Course  Procedures (including critical care time) Labs Review Labs Reviewed - No data to display  Imaging Review No results found.   EKG Interpretation None      MDM   Final diagnoses:  Bilateral low back pain without sciatica  Muscle spasm of back    38y/o male with acute on chronic back pain after recent physical stress. No red flag s/s of low back pain. No s/s of central cord compression or cauda equina. Lower  extremities are neurovascularly intact and patient is ambulating without difficulty. Doubt any kidney related issue, no need for urgent imaging or labs at this time.   Patient was counseled on back pain precautions and told to do activity as tolerated but do not lift, push, or pull heavy objects more than 10 pounds for the next week. Patient counseled to use ice or heat on back for no longer than 15 minutes every hour.   Rx given for muscle relaxer and counseled on proper use of muscle relaxant medication. Rx given for narcotic pain medicine and counseled on proper use of narcotic pain medications. Told that they can increase to every 4 hrs if needed while pain is worse. Counseled not to combine this medication with others containing tylenol. Urged patient not to drink alcohol, drive, or perform any other activities that requires focus while taking either of these medications.   Patient urged to follow-up with PCP if pain does not improve with treatment and rest or if pain becomes recurrent. Urged to return with worsening severe pain, loss of bowel or bladder control, trouble walking. The patient verbalizes understanding and agrees with the plan.  Meds ordered this encounter  Medications  . HYDROcodone-acetaminophen (NORCO) 5-325 MG per tablet    Sig: Take 1-2 tablets by mouth every 6 (six) hours as needed for severe pain.    Dispense:  6 tablet    Refill:  0    Order Specific Question:  Supervising Provider    Answer:  Eber Hong D [[id number]]  . naproxen (NAPROSYN) 500 MG tablet    Sig: Take 1 tablet (500 mg total) by mouth 2 (two) times daily as needed for mild pain, moderate pain or headache (TAKE WITH MEALS.).    Dispense:  10 tablet    Refill:  0    Order Specific Question:  Supervising Provider    Answer:  Eber Hong D [[id number]]  . cyclobenzaprine (FLEXERIL) 10 MG tablet    Sig: Take 1 tablet (10 mg total) by mouth 3 (three) times daily as needed for muscle spasms.    Dispense:  10  tablet    Refill:  0    Order Specific Question:  Supervising Provider    Answer:  Vida Roller [address] Gintz Camprubi-Soms, PA-C 10/21/13 1732

## 2013-10-21 NOTE — Discharge Instructions (Signed)
Back Pain:  Your back pain should be treated with medicines such as ibuprofen or aleve and this back pain should get better over the next 2 weeks.  However if you develop severe or worsening pain, low back pain with fever, numbness, weakness or inability to walk or urinate, you should return to the ER immediately.  Please follow up with your doctor this week for a recheck if still having symptoms.  Low back pain is discomfort in the lower back that may be due to injuries to muscles and ligaments around the spine.  Occasionally, it may be caused by a a problem to a part of the spine called a disc.  The pain may last several days or a week;  However, most patients get completely well in 4 weeks.  Self - care:  The application of heat can help soothe the pain.  Maintaining your daily activities, including walking, is encourged, as it will help you get better faster than just staying in bed. Perform gentle stretching as discussed. Drink plenty of fluids.  Medications are also useful to help with pain control.  A commonly prescribed medications includes Norco.  Don't drive while taking this medication, and don't use any additional tylenol.  Non steroidal anti inflammatory medications including Ibuprofen and naproxen;  These medications help both pain and swelling and are very useful in treating back pain.  They should be taken with food, as they can cause stomach upset, and more seriously, stomach bleeding.    Muscle relaxants:  These medications can help with muscle tightness that is a cause of lower back pain.  Most of these medications can cause drowsiness, and it is not safe to drive or use dangerous machinery while taking them.  SEEK IMMEDIATE MEDICAL ATTENTION IF: New numbness, tingling, weakness, or problem with the use of your arms or legs.  Severe back pain not relieved with medications.  Difficulty with or loss of control of your bowel or bladder control.  Increasing pain in any areas of the  body (such as chest or abdominal pain).  Shortness of breath, dizziness or fainting.  Nausea (feeling sick to your stomach), vomiting, fever, or sweats.  You will need to follow up with  Your primary healthcare provider in 1-2 weeks for reassessment.    Back Pain, Adult Back pain is very common. The pain often gets better over time. The cause of back pain is usually not dangerous. Most people can learn to manage their back pain on their own.  HOME CARE   Stay active. Start with short walks on flat ground if you can. Try to walk farther each day.  Do not sit, drive, or stand in one place for more than 30 minutes. Do not stay in bed.  Do not avoid exercise or work. Activity can help your back heal faster.  Be careful when you bend or lift an object. Bend at your knees, keep the object close to you, and do not twist.  Sleep on a firm mattress. Lie on your side, and bend your knees. If you lie on your back, put a pillow under your knees.  Only take medicines as told by your doctor.  Put ice on the injured area.  Put ice in a plastic bag.  Place a towel between your skin and the bag.  Leave the ice on for 15-20 minutes, 03-04 times a day for the first 2 to 3 days. After that, you can switch between ice and heat packs.  Ask  your doctor about back exercises or massage.  Avoid feeling anxious or stressed. Find good ways to deal with stress, such as exercise. GET HELP RIGHT AWAY IF:   Your pain does not go away with rest or medicine.  Your pain does not go away in 1 week.  You have new problems.  You do not feel well.  The pain spreads into your legs.  You cannot control when you poop (bowel movement) or pee (urinate).  Your arms or legs feel weak or lose feeling (numbness).  You feel sick to your stomach (nauseous) or throw up (vomit).  You have belly (abdominal) pain.  You feel like you may pass out (faint). MAKE SURE YOU:   Understand these instructions.  Will  watch your condition.  Will get help right away if you are not doing well or get worse. Document Released: 07/13/2007 Document Revised: 04/18/2011 Document Reviewed: 05/28/2013 [facility] Patient Information 2015 [employer], [place]. This information is not intended to replace advice given to you by your health care provider. Make sure you discuss any questions you have with your health care provider.  Back Exercises Back exercises help treat and prevent back injuries. The goal is to increase your strength in your belly (abdominal) and back muscles. These exercises can also help with flexibility. Start these exercises when told by your doctor. HOME CARE Back exercises include: Pelvic Tilt.  Lie on your back with your knees bent. Tilt your pelvis until the lower part of your back is against the floor. Hold this position 5 to 10 sec. Repeat this exercise 5 to 10 times. Knee to Chest.  Pull 1 knee up against your chest and hold for 20 to 30 seconds. Repeat this with the other knee. This may be done with the other leg straight or bent, whichever feels better. Then, pull both knees up against your chest. Sit-Ups or Curl-Ups.  Bend your knees 90 degrees. Start with tilting your pelvis, and do a partial, slow sit-up. Only lift your upper half 30 to 45 degrees off the floor. Take at least 2 to 3 seonds for each sit-up. Do not do sit-ups with your knees out straight. If partial sit-ups are difficult, simply do the above but with only tightening your belly (abdominal) muscles and holding it as told. Hip-Lift.  Lie on your back with your knees flexed 90 degrees. Push down with your feet and shoulders as you raise your hips 2 inches off the floor. Hold for 10 seconds, repeat 5 to 10 times. Back Arches.  Lie on your stomach. Prop yourself up on bent elbows. Slowly press on your hands, causing an arch in your low back. Repeat 3 to 5 times. Shoulder-Lifts.  Lie face down with arms beside your body. Keep hips  and belly pressed to floor as you slowly lift your head and shoulders off the floor. Do not overdo your exercises. Be careful in the beginning. Exercises may cause you some mild back discomfort. If the pain lasts for more than 15 minutes, stop the exercises until you see your doctor. Improvement with exercise for back problems is slow.  Document Released: 02/26/2010 Document Revised: 04/18/2011 Document Reviewed: 11/25/2010 [facility] Patient Information 2015 [place], [place]. This information is not intended to replace advice given to you by your health care provider. Make sure you discuss any questions you have with your health care provider.  Heat Therapy Heat therapy can help make painful, stiff muscles and joints feel better. Do not use heat on new  injuries. Wait at least 48 hours after an injury to use heat. Do not use heat when you have aches or pains right after an activity. If you still have pain 3 hours after stopping the activity, then you may use heat. HOME CARE Wet heat pack  Soak a clean towel in warm water. Squeeze out the extra water.  Put the warm, wet towel in a plastic bag.  Place a thin, dry towel between your skin and the bag.  Put the heat pack on the area for 5 minutes, and check your skin. Your skin may be pink, but it should not be red.  Leave the heat pack on the area for 15 to 30 minutes.  Repeat this every 2 to 4 hours while awake. Do not use heat while you are sleeping. Warm water bath  Fill a tub with warm water.  Place the affected body part in the tub.  Soak the area for 20 to 40 minutes.  Repeat as needed. Hot water bottle  Fill the water bottle half full with hot water.  Press out the extra air. Close the cap tightly.  Place a dry towel between your skin and the bottle.  Put the bottle on the area for 5 minutes, and check your skin. Your skin may be pink, but it should not be red.  Leave the bottle on the area for 15 to 30 minutes.  Repeat this  every 2 to 4 hours while awake. Electric heating pad  Place a dry towel between your skin and the heating pad.  Set the heating pad on low heat.  Put the heating pad on the area for 10 minutes, and check your skin. Your skin may be pink, but it should not be red.  Leave the heating pad on the area for 20 to 40 minutes.  Repeat this every 2 to 4 hours while awake.  Do not lie on the heating pad.  Do not fall asleep while using the heating pad.  Do not use the heating pad near water. GET HELP RIGHT AWAY IF:  You get blisters or red skin.  Your skin is puffy (swollen), or you lose feeling (numbness) in the affected area.  You have any new problems.  Your problems are getting worse.  You have any questions or concerns. If you have any problems, stop using heat therapy until you see your doctor. MAKE SURE YOU:  Understand these instructions.  Will watch your condition.  Will get help right away if you are not doing well or get worse. Document Released: 04/18/2011 Document Reviewed: 03/19/2013 [employer] [facility] [employer] Patient Information 2015 [place], [place]. This information is not intended to replace advice given to you by your health care provider. Make sure you discuss any questions you have with your health care provider.

## 2013-10-21 NOTE — ED Notes (Signed)
States has chronic back pain  Worse x 1 week

## 2013-10-21 NOTE — ED Provider Notes (Signed)
Medical screening examination/treatment/procedure(s) were performed by non-physician practitioner and as supervising physician I was immediately available for consultation/collaboration.   EKG Interpretation None       Ethelda Chick, MD 10/21/13 [phone]

## 2014-03-14 ENCOUNTER — Emergency Department (HOSPITAL_BASED_OUTPATIENT_CLINIC_OR_DEPARTMENT_OTHER)
Admission: EM | Admit: 2014-03-14 | Discharge: 2014-03-14 | Disposition: A | Discharge status: Other Acute Inpatient Hospital | Payer: Medicare Other | Attending: Emergency Medicine | Admitting: Emergency Medicine

## 2014-03-14 ENCOUNTER — Encounter (HOSPITAL_BASED_OUTPATIENT_CLINIC_OR_DEPARTMENT_OTHER): Payer: Self-pay | Admitting: *Deleted

## 2014-03-14 DIAGNOSIS — R300 Dysuria: Secondary | ICD-10-CM | POA: Diagnosis present

## 2014-03-14 DIAGNOSIS — Z792 Long term (current) use of antibiotics: Secondary | ICD-10-CM | POA: Insufficient documentation

## 2014-03-14 DIAGNOSIS — F329 Major depressive disorder, single episode, unspecified: Secondary | ICD-10-CM | POA: Insufficient documentation

## 2014-03-14 DIAGNOSIS — Z880 Allergy status to penicillin: Secondary | ICD-10-CM | POA: Insufficient documentation

## 2014-03-14 DIAGNOSIS — K219 Gastro-esophageal reflux disease without esophagitis: Secondary | ICD-10-CM | POA: Insufficient documentation

## 2014-03-14 DIAGNOSIS — E119 Type 2 diabetes mellitus without complications: Secondary | ICD-10-CM | POA: Diagnosis not present

## 2014-03-14 DIAGNOSIS — Z87891 Personal history of nicotine dependence: Secondary | ICD-10-CM | POA: Diagnosis not present

## 2014-03-14 DIAGNOSIS — Z7952 Long term (current) use of systemic steroids: Secondary | ICD-10-CM | POA: Insufficient documentation

## 2014-03-14 DIAGNOSIS — Z79899 Other long term (current) drug therapy: Secondary | ICD-10-CM | POA: Diagnosis not present

## 2014-03-14 DIAGNOSIS — T8613 Kidney transplant infection: Secondary | ICD-10-CM | POA: Diagnosis not present

## 2014-03-14 LAB — CBC WITH DIFFERENTIAL/PLATELET
BASOS PCT: 0 % (ref 0–1)
Basophils Absolute: 0 10*3/uL (ref 0.0–0.1)
EOS ABS: 0 10*3/uL (ref 0.0–0.7)
EOS PCT: 0 % (ref 0–5)
HEMATOCRIT: 32.8 % — AB (ref 39.0–52.0)
HEMOGLOBIN: 10.9 g/dL — AB (ref 13.0–17.0)
Lymphocytes Relative: 5 % — ABNORMAL LOW (ref 12–46)
Lymphs Abs: 0.7 10*3/uL (ref 0.7–4.0)
MCH: 26.8 pg (ref 26.0–34.0)
MCHC: 33.2 g/dL (ref 30.0–36.0)
MCV: 80.8 fL (ref 78.0–100.0)
MONO ABS: 2 10*3/uL — AB (ref 0.1–1.0)
MONOS PCT: 14 % — AB (ref 3–12)
Neutro Abs: 11.6 10*3/uL — ABNORMAL HIGH (ref 1.7–7.7)
Neutrophils Relative %: 81 % — ABNORMAL HIGH (ref 43–77)
PLATELETS: 144 10*3/uL — AB (ref 150–400)
RBC: 4.06 MIL/uL — AB (ref 4.22–5.81)
RDW: 14.4 % (ref 11.5–15.5)
WBC: 14.2 10*3/uL — AB (ref 4.0–10.5)

## 2014-03-14 LAB — URINALYSIS, ROUTINE W REFLEX MICROSCOPIC
Bilirubin Urine: NEGATIVE
GLUCOSE, UA: NEGATIVE mg/dL
Ketones, ur: NEGATIVE mg/dL
Nitrite: NEGATIVE
PH: 5.5 (ref 5.0–8.0)
Protein, ur: >300 mg/dL — AB
SPECIFIC GRAVITY, URINE: 1.015 (ref 1.005–1.030)
UROBILINOGEN UA: 0.2 mg/dL (ref 0.0–1.0)

## 2014-03-14 LAB — COMPREHENSIVE METABOLIC PANEL
ALBUMIN: 4 g/dL (ref 3.5–5.2)
ALT: 10 U/L (ref 0–53)
ANION GAP: 7 (ref 5–15)
AST: 16 U/L (ref 0–37)
Alkaline Phosphatase: 82 U/L (ref 39–117)
BUN: 47 mg/dL — ABNORMAL HIGH (ref 6–23)
CALCIUM: 8.8 mg/dL (ref 8.4–10.5)
CHLORIDE: 106 mmol/L (ref 96–112)
CO2: 16 mmol/L — AB (ref 19–32)
CREATININE: 4.22 mg/dL — AB (ref 0.50–1.35)
GFR calc non Af Amer: 16 mL/min — ABNORMAL LOW (ref >90)
GFR, EST AFRICAN AMERICAN: 19 mL/min — AB (ref >90)
Glucose, Bld: 114 mg/dL — ABNORMAL HIGH (ref 70–99)
POTASSIUM: 3.9 mmol/L (ref 3.5–5.1)
SODIUM: 129 mmol/L — AB (ref 135–145)
TOTAL PROTEIN: 6.9 g/dL (ref 6.0–8.3)
Total Bilirubin: 0.3 mg/dL (ref 0.3–1.2)

## 2014-03-14 LAB — URINE MICROSCOPIC-ADD ON

## 2014-03-14 LAB — I-STAT CG4 LACTIC ACID, ED: LACTIC ACID, VENOUS: 1.09 mmol/L (ref 0.5–2.0)

## 2014-03-14 MED ORDER — DEXTROSE 5 % IV SOLN
2.0000 g | Freq: Once | INTRAVENOUS | Status: AC
  Administered 2014-03-14: 2 g via INTRAVENOUS

## 2014-03-14 MED ORDER — ONDANSETRON HCL 4 MG/2ML IJ SOLN
4.0000 mg | Freq: Once | INTRAMUSCULAR | Status: AC
  Administered 2014-03-14: 4 mg via INTRAVENOUS
  Filled 2014-03-14: qty 2

## 2014-03-14 MED ORDER — FENTANYL CITRATE 0.05 MG/ML IJ SOLN
100.0000 ug | Freq: Once | INTRAMUSCULAR | Status: AC
  Administered 2014-03-14: 100 ug via INTRAVENOUS
  Filled 2014-03-14: qty 2

## 2014-03-14 MED ORDER — SODIUM CHLORIDE 0.9 % IV BOLUS (SEPSIS)
30.0000 mL/kg | Freq: Once | INTRAVENOUS | Status: AC
  Administered 2014-03-14: 1416 mL via INTRAVENOUS

## 2014-03-14 MED ORDER — CEFEPIME HCL 2 G IJ SOLR
INTRAMUSCULAR | Status: AC
  Filled 2014-03-14: qty 2

## 2014-03-14 MED ORDER — FENTANYL CITRATE 0.05 MG/ML IJ SOLN
50.0000 ug | Freq: Once | INTRAMUSCULAR | Status: AC
  Administered 2014-03-14: 50 ug via INTRAVENOUS
  Filled 2014-03-14: qty 2

## 2014-03-14 NOTE — ED Notes (Signed)
Pt alert, NAD, calm, interactive, resps e/u, speaking in clear complete sentences, here with wife. 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 Also mentions diarrhea and HA. (denies: fever). No meds PTA. Urine produced upon arrival is cloudy with sediment ~ 3cc. Sx onset Wednesday.

## 2014-03-14 NOTE — ED Notes (Signed)
No changes, [facility]

## 2014-03-14 NOTE — ED Notes (Signed)
Dr. Read DriversMolpus in to update pt/ family about results and plan to transfer/admit. Remains alert, NAD, calm, interactive, abx infusing, VSS. [facility]

## 2014-03-14 NOTE — ED Provider Notes (Signed)
CSN: [id number]     Arrival date & time 03/14/14  0334 History   First MD Initiated Contact with Patient 03/14/14 0411     Chief Complaint  Patient presents with  . Dysuria     (Consider location/radiation/quality/duration/timing/severity/associated sxs/prior Treatment) HPI  This is a 39 year old male status post renal transplant. He was briefly off his CellCept for about a week but restarted it 2 days ago. He is here this morning with difficulty urinating, earning with urination and intermittent urinary urgency. These symptoms began yesterday. He is also having pain at the site of his transplanted kidney. He rates his pain about a 7 out of 10. He is having chills. He was noted to have a low-grade fever on arrival. He denies chest pain, shortness of breath or vomiting. He has had nausea and diarrhea.  Past Medical History  Diagnosis Date  . Osteoporosis   . Renal disorder   . Diabetes mellitus   . Hypertension   . GERD (gastroesophageal reflux disease)   . Depression    Past Surgical History  Procedure Laterality Date  . Nephrectomy transplanted organ    . Combined kidney-pancreas transplant     No family history on file. History  Substance Use Topics  . Smoking status: Former Smoker -- 0.50 packs/day  . Smokeless tobacco: Not on file  . Alcohol Use: No    Review of Systems  All other systems reviewed and are negative.   Allergies  Penicillins  Home Medications   Prior to Admission medications   Medication Sig Start Date End Date Taking? Authorizing Provider  albuterol (PROVENTIL HFA;VENTOLIN HFA) 108 (90 BASE) MCG/ACT inhaler Inhale 2 puffs into the lungs every 4 (four) hours as needed. For shortness of breath    Historical Provider, MD  CALCIUM-VITAMIN D PO Take 1 tablet by mouth daily.    Historical Provider, MD  cyclobenzaprine (FLEXERIL) 10 MG tablet Take 1 tablet (10 mg total) by mouth 3 (three) times daily as needed for muscle spasms. 11/19/11   Kaitlyn  Szekalski, PA-C  cyclobenzaprine (FLEXERIL) 10 MG tablet Take 1 tablet (10 mg total) by mouth 3 (three) times daily as needed for muscle spasms. 10/21/13   Mercedes Strupp Camprubi-Soms, PA-C  esomeprazole (NEXIUM) 20 MG capsule Take 20 mg by mouth daily before breakfast.    Historical Provider, MD  fish oil-omega-3 fatty acids 1000 MG capsule Take 1 g by mouth daily.    Historical Provider, MD  HYDROcodone-acetaminophen (NORCO) 5-325 MG per tablet Take 1-2 tablets by mouth every 6 (six) hours as needed for severe pain. 10/21/13   Mercedes Strupp Camprubi-Soms, PA-C  labetalol (NORMODYNE) 200 MG tablet Take 200 mg by mouth 2 (two) times daily.    Historical Provider, MD  MILK THISTLE PO Take 1 tablet by mouth daily.    Historical Provider, MD  Multiple Vitamin (MULTIVITAMIN WITH MINERALS) TABS Take 1 tablet by mouth daily.    Historical Provider, MD  mycophenolate (CELLCEPT) 250 MG capsule Take 750 mg by mouth 2 (two) times daily.    Historical Provider, MD  naproxen (NAPROSYN) 500 MG tablet Take 1 tablet (500 mg total) by mouth 2 (two) times daily as needed for mild pain, moderate pain or headache (TAKE WITH MEALS.). 10/21/13   Mercedes Strupp Camprubi-Soms, PA-C  predniSONE (STERAPRED UNI-PAK) 5 MG TABS Take 7.5 mg by mouth daily.    Historical Provider, MD  sulfamethoxazole-trimethoprim (BACTRIM DS) 800-160 MG per tablet Take 1 tablet by mouth 2 (two) times a week.  On Mon and Thurs    Historical Provider, MD  tacrolimus (PROGRAF) 1 MG capsule Take 3 mg by mouth 2 (two) times daily.    Historical Provider, MD   BP 92/53 mmHg  Pulse 87  Temp(Src) 99.8 F (37.7 C) (Oral)  Resp 18  Ht 5' 5.5" (1.664 m)  Wt 104 lb (47.174 kg)  BMI 17.04 kg/m2  SpO2 90%   Physical Exam  General: Well-developed, cachectic male in no acute distress; appears older than age of record HENT: normocephalic; atraumatic Eyes: pupils equal, round and reactive to light; extraocular muscles intact Neck: supple Heart:  regular rate and rhythm; systolic murmur loudest at right upper sternal border Lungs: clear to auscultation bilaterally Abdomen: soft; nondistended; right lower quadrant tenderness at site of transplanted kidney; bowel sounds present Extremities: No deformity; full range of motion; pulses normal Neurologic: Awake, alert and oriented; motor function intact in all extremities and symmetric; no facial droop Skin: Warm and dry Psychiatric: flat affect    ED Course  Procedures (including critical care time)   MDM  Nursing notes and vitals signs, including pulse oximetry, reviewed.  Summary of this visit's results, reviewed by myself:  Labs:  Results for orders placed or performed during the hospital encounter of 03/14/14 (from the past 24 hour(s))  Urinalysis, Routine w reflex microscopic     Status: Abnormal   Collection Time: 03/14/14  3:51 AM  Result Value Ref Range   Color, Urine YELLOW YELLOW   APPearance TURBID (A) CLEAR   Specific Gravity, Urine 1.015 1.005 - 1.030   pH 5.5 5.0 - 8.0   Glucose, UA NEGATIVE NEGATIVE mg/dL   Hgb urine dipstick LARGE (A) NEGATIVE   Bilirubin Urine NEGATIVE NEGATIVE   Ketones, ur NEGATIVE NEGATIVE mg/dL   Protein, ur >811 (A) NEGATIVE mg/dL   Urobilinogen, UA 0.2 0.0 - 1.0 mg/dL   Nitrite NEGATIVE NEGATIVE   Leukocytes, UA LARGE (A) NEGATIVE  Urine microscopic-add on     Status: Abnormal   Collection Time: 03/14/14  3:51 AM  Result Value Ref Range   Squamous Epithelial / LPF FEW (A) RARE   WBC, UA TOO NUMEROUS TO COUNT <3 WBC/hpf   RBC / HPF TOO NUMEROUS TO COUNT <3 RBC/hpf   Bacteria, UA MANY (A) RARE  Comprehensive metabolic panel     Status: Abnormal   Collection Time: 03/14/14  4:14 AM  Result Value Ref Range   Sodium 129 (L) 135 - 145 mmol/L   Potassium 3.9 3.5 - 5.1 mmol/L   Chloride 106 96 - 112 mmol/L   CO2 16 (L) 19 - 32 mmol/L   Glucose, Bld 114 (H) 70 - 99 mg/dL   BUN 47 (H) 6 - 23 mg/dL   Creatinine, Ser 9.14 (H) 0.50 -  1.35 mg/dL   Calcium 8.8 8.4 - 78.2 mg/dL   Total Protein 6.9 6.0 - 8.3 g/dL   Albumin 4.0 3.5 - 5.2 g/dL   AST 16 0 - 37 U/L   ALT 10 0 - 53 U/L   Alkaline Phosphatase 82 39 - 117 U/L   Total Bilirubin 0.3 0.3 - 1.2 mg/dL   GFR calc non Af Amer 16 (L) >90 mL/min   GFR calc Af Amer 19 (L) >90 mL/min   Anion gap 7 5 - 15  CBC with Differential     Status: Abnormal   Collection Time: 03/14/14  4:14 AM  Result Value Ref Range   WBC 14.2 (H) 4.0 - 10.5 K/uL  RBC 4.06 (L) 4.22 - 5.81 MIL/uL   Hemoglobin 10.9 (L) 13.0 - 17.0 g/dL   HCT 81.1 (L) 91.4 - 78.2 %   MCV 80.8 78.0 - 100.0 fL   MCH 26.8 26.0 - 34.0 pg   MCHC 33.2 30.0 - 36.0 g/dL   RDW 95.6 21.3 - 08.6 %   Platelets 144 (L) 150 - 400 K/uL   Neutrophils Relative % 81 (H) 43 - 77 %   Neutro Abs 11.6 (H) 1.7 - 7.7 K/uL   Lymphocytes Relative 5 (L) 12 - 46 %   Lymphs Abs 0.7 0.7 - 4.0 K/uL   Monocytes Relative 14 (H) 3 - 12 %   Monocytes Absolute 2.0 (H) 0.1 - 1.0 K/uL   Eosinophils Relative 0 0 - 5 %   Eosinophils Absolute 0.0 0.0 - 0.7 K/uL   Basophils Relative 0 0 - 1 %   Basophils Absolute 0.0 0.0 - 0.1 K/uL  I-Stat CG4 Lactic Acid, ED     Status: None   Collection Time: 03/14/14  4:26 AM  Result Value Ref Range   Lactic Acid, Venous 1.09 0.5 - 2.0 mmol/L   4:49 AM Cefepime 2 grams started for urinary tract infection. Patient does not meet SIRS criteria at this time.  5:05 AM Dr. Tasia Catchings accepts for transfer to [facility].     Hanley Seamen, MD 03/14/14 [phone]

## 2014-03-14 NOTE — ED Notes (Signed)
[id number].

## 2014-03-15 ENCOUNTER — Telehealth (HOSPITAL_BASED_OUTPATIENT_CLINIC_OR_DEPARTMENT_OTHER): Payer: Self-pay | Admitting: Emergency Medicine

## 2014-03-15 NOTE — ED Notes (Signed)
Received [facility] regarding positive blood cultures, informed pt had +bc aerobic bottle, gram negative rods. [facility]  And verified pt was transferred to room 7cc. Informed Katie, Consulting civil engineerCharge RN on unit 7cc that patient had + bc aerobic  Bottle gram negative rods.

## 2014-03-17 LAB — URINE CULTURE

## 2014-03-17 LAB — CULTURE, BLOOD (ROUTINE X 2)

## 2014-03-20 LAB — CULTURE, BLOOD (ROUTINE X 2): CULTURE: NO GROWTH

## 2014-04-01 ENCOUNTER — Emergency Department (HOSPITAL_BASED_OUTPATIENT_CLINIC_OR_DEPARTMENT_OTHER): Payer: Medicare Other

## 2014-04-01 ENCOUNTER — Emergency Department (HOSPITAL_BASED_OUTPATIENT_CLINIC_OR_DEPARTMENT_OTHER)
Admission: EM | Admit: 2014-04-01 | Discharge: 2014-04-01 | Disposition: A | Discharge status: Home / Self Care | Payer: Medicare Other | Attending: Emergency Medicine | Admitting: Emergency Medicine

## 2014-04-01 ENCOUNTER — Encounter (HOSPITAL_BASED_OUTPATIENT_CLINIC_OR_DEPARTMENT_OTHER): Payer: Self-pay

## 2014-04-01 DIAGNOSIS — J40 Bronchitis, not specified as acute or chronic: Secondary | ICD-10-CM

## 2014-04-01 DIAGNOSIS — E119 Type 2 diabetes mellitus without complications: Secondary | ICD-10-CM | POA: Insufficient documentation

## 2014-04-01 DIAGNOSIS — K219 Gastro-esophageal reflux disease without esophagitis: Secondary | ICD-10-CM | POA: Diagnosis not present

## 2014-04-01 DIAGNOSIS — Z791 Long term (current) use of non-steroidal anti-inflammatories (NSAID): Secondary | ICD-10-CM | POA: Insufficient documentation

## 2014-04-01 DIAGNOSIS — Z79899 Other long term (current) drug therapy: Secondary | ICD-10-CM | POA: Diagnosis not present

## 2014-04-01 DIAGNOSIS — Z8739 Personal history of other diseases of the musculoskeletal system and connective tissue: Secondary | ICD-10-CM | POA: Insufficient documentation

## 2014-04-01 DIAGNOSIS — J209 Acute bronchitis, unspecified: Secondary | ICD-10-CM | POA: Insufficient documentation

## 2014-04-01 DIAGNOSIS — I10 Essential (primary) hypertension: Secondary | ICD-10-CM | POA: Diagnosis not present

## 2014-04-01 DIAGNOSIS — Z87448 Personal history of other diseases of urinary system: Secondary | ICD-10-CM | POA: Insufficient documentation

## 2014-04-01 DIAGNOSIS — Z87891 Personal history of nicotine dependence: Secondary | ICD-10-CM | POA: Diagnosis not present

## 2014-04-01 DIAGNOSIS — Z880 Allergy status to penicillin: Secondary | ICD-10-CM | POA: Insufficient documentation

## 2014-04-01 DIAGNOSIS — R0602 Shortness of breath: Secondary | ICD-10-CM | POA: Diagnosis present

## 2014-04-01 LAB — CBC WITH DIFFERENTIAL/PLATELET
BASOS ABS: 0 10*3/uL (ref 0.0–0.1)
Basophils Relative: 0 % (ref 0–1)
Eosinophils Absolute: 0.1 10*3/uL (ref 0.0–0.7)
Eosinophils Relative: 1 % (ref 0–5)
HCT: 27.6 % — ABNORMAL LOW (ref 39.0–52.0)
HEMOGLOBIN: 8.7 g/dL — AB (ref 13.0–17.0)
Lymphocytes Relative: 13 % (ref 12–46)
Lymphs Abs: 1.4 10*3/uL (ref 0.7–4.0)
MCH: 26.4 pg (ref 26.0–34.0)
MCHC: 31.5 g/dL (ref 30.0–36.0)
MCV: 83.9 fL (ref 78.0–100.0)
MONOS PCT: 9 % (ref 3–12)
Monocytes Absolute: 0.9 10*3/uL (ref 0.1–1.0)
NEUTROS PCT: 77 % (ref 43–77)
Neutro Abs: 8.1 10*3/uL — ABNORMAL HIGH (ref 1.7–7.7)
Platelets: 217 10*3/uL (ref 150–400)
RBC: 3.29 MIL/uL — ABNORMAL LOW (ref 4.22–5.81)
RDW: 15.1 % (ref 11.5–15.5)
WBC: 10.6 10*3/uL — ABNORMAL HIGH (ref 4.0–10.5)

## 2014-04-01 LAB — COMPREHENSIVE METABOLIC PANEL
ALK PHOS: 75 U/L (ref 39–117)
ALT: 31 U/L (ref 0–53)
ANION GAP: 0 — AB (ref 5–15)
AST: 22 U/L (ref 0–37)
Albumin: 3.4 g/dL — ABNORMAL LOW (ref 3.5–5.2)
BILIRUBIN TOTAL: 0.3 mg/dL (ref 0.3–1.2)
BUN: 27 mg/dL — AB (ref 6–23)
CHLORIDE: 113 mmol/L — AB (ref 96–112)
CO2: 24 mmol/L (ref 19–32)
Calcium: 8.6 mg/dL (ref 8.4–10.5)
Creatinine, Ser: 1.78 mg/dL — ABNORMAL HIGH (ref 0.50–1.35)
GFR, EST AFRICAN AMERICAN: 54 mL/min — AB (ref >90)
GFR, EST NON AFRICAN AMERICAN: 46 mL/min — AB (ref >90)
GLUCOSE: 85 mg/dL (ref 70–99)
Potassium: 4.6 mmol/L (ref 3.5–5.1)
Sodium: 137 mmol/L (ref 135–145)
TOTAL PROTEIN: 5.9 g/dL — AB (ref 6.0–8.3)

## 2014-04-01 LAB — URINALYSIS, ROUTINE W REFLEX MICROSCOPIC
Bilirubin Urine: NEGATIVE
GLUCOSE, UA: NEGATIVE mg/dL
Hgb urine dipstick: NEGATIVE
Ketones, ur: NEGATIVE mg/dL
Leukocytes, UA: NEGATIVE
Nitrite: NEGATIVE
PH: 6 (ref 5.0–8.0)
Protein, ur: NEGATIVE mg/dL
Specific Gravity, Urine: 1.013 (ref 1.005–1.030)
Urobilinogen, UA: 0.2 mg/dL (ref 0.0–1.0)

## 2014-04-01 LAB — BRAIN NATRIURETIC PEPTIDE: B Natriuretic Peptide: 444.5 pg/mL — ABNORMAL HIGH (ref 0.0–100.0)

## 2014-04-01 MED ORDER — ALBUTEROL SULFATE HFA 108 (90 BASE) MCG/ACT IN AERS: 1.0000 | INHALATION_SPRAY | Freq: Four times a day (QID) | RESPIRATORY_TRACT | Status: AC | PRN

## 2014-04-01 MED ORDER — ALBUTEROL SULFATE (2.5 MG/3ML) 0.083% IN NEBU
5.0000 mg | INHALATION_SOLUTION | Freq: Once | RESPIRATORY_TRACT | Status: AC
  Administered 2014-04-01: 5 mg via RESPIRATORY_TRACT
  Filled 2014-04-01: qty 6

## 2014-04-01 MED ORDER — AZITHROMYCIN 250 MG PO TABS
250.0000 mg | ORAL_TABLET | Freq: Every day | ORAL | Status: DC
Start: 2014-04-01 — End: 2014-05-01

## 2014-04-01 NOTE — ED Notes (Signed)
Pt reports cough and shortness of breath x 2-3 days. Non productive cough but sts "its deep"

## 2014-04-01 NOTE — ED Provider Notes (Signed)
CSN: [id number]     Arrival date & time 04/01/14  0645 History   First MD Initiated Contact with Patient 04/01/14 [phone]     Chief Complaint  Patient presents with  . Shortness of Breath     Patient is a 39 y.o. male presenting with shortness of breath. The history is provided by the patient. No language interpreter was used.  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 He presents for evaluation of cough and shortness of breath. His symptoms have been present for the last 2-3 days. He was hospitalized 2 weeks ago for urinary tract infection with Escherichia coli and is blood. He is feeling sluggish for the first week after hospitalization but was beginning to feel better last week. He reports dyspnea on exertion and a sensation of bloating in his abdomen. He denies any fevers. He has a nonproductive cough. His chest feels tight at times. He denies any pleuritic chest pain. He denies any lower extremity edema. He was in kidney failure when he was in the hospital 2 weeks ago but his kidney labs were improving a week ago with nephrology follow-up. Symptoms are moderate, constant.  Past Medical History  Diagnosis Date  . Osteoporosis   . Renal disorder   . Diabetes mellitus   . Hypertension   . GERD (gastroesophageal reflux disease)   . Depression    Past Surgical History  Procedure Laterality Date  . Nephrectomy transplanted organ    . Combined kidney-pancreas transplant     No family history on file. History  Substance Use Topics  . Smoking status: Former Smoker -- 0.50 packs/day  . Smokeless tobacco: Not on file  . Alcohol Use: No    Review of Systems  Respiratory: Positive for shortness of breath.   All other systems reviewed and are negative.     Allergies  Penicillins  Home Medications   Prior to Admission medications   Medication Sig Start Date End Date Taking? Authorizing Provider  albuterol (PROVENTIL HFA;VENTOLIN HFA) 108 (90  BASE) MCG/ACT inhaler Inhale 2 puffs into the lungs every 4 (four) hours as needed. For shortness of breath    Historical Provider, MD  CALCIUM-VITAMIN D PO Take 1 tablet by mouth daily.    Historical Provider, MD  cyclobenzaprine (FLEXERIL) 10 MG tablet Take 1 tablet (10 mg total) by mouth 3 (three) times daily as needed for muscle spasms. 11/19/11   Kaitlyn Szekalski, PA-C  cyclobenzaprine (FLEXERIL) 10 MG tablet Take 1 tablet (10 mg total) by mouth 3 (three) times daily as needed for muscle spasms. 10/21/13   Mercedes Strupp Camprubi-Soms, PA-C  esomeprazole (NEXIUM) 20 MG capsule Take 20 mg by mouth daily before breakfast.    Historical Provider, MD  fish oil-omega-3 fatty acids 1000 MG capsule Take 1 g by mouth daily.    Historical Provider, MD  HYDROcodone-acetaminophen (NORCO) 5-325 MG per tablet Take 1-2 tablets by mouth every 6 (six) hours as needed for severe pain. 10/21/13   Mercedes Strupp Camprubi-Soms, PA-C  labetalol (NORMODYNE) 200 MG tablet Take 200 mg by mouth 2 (two) times daily.    Historical Provider, MD  MILK THISTLE PO Take 1 tablet by mouth daily.    Historical Provider, MD  Multiple Vitamin (MULTIVITAMIN WITH MINERALS) TABS Take 1 tablet by mouth daily.    Historical Provider, MD  mycophenolate (CELLCEPT) 250 MG capsule Take 750 mg by mouth 2 (two) times daily.    Historical Provider, MD  naproxen (NAPROSYN) 500 MG tablet Take 1 tablet (500 mg total) by mouth 2 (two) times daily as needed for mild pain, moderate pain or headache (TAKE WITH MEALS.). 10/21/13   Mercedes Strupp Camprubi-Soms, PA-C  predniSONE (STERAPRED UNI-PAK) 5 MG TABS Take 7.5 mg by mouth daily.    Historical Provider, MD  sulfamethoxazole-trimethoprim (BACTRIM DS) 800-160 MG per tablet Take 1 tablet by mouth 2 (two) times a week. On Mon and Thurs    Historical Provider, MD  tacrolimus (PROGRAF) 1 MG capsule Take 3 mg by mouth 2 (two) times daily.    Historical Provider, MD   BP 141/75 mmHg  Pulse 80   Temp(Src) 98.1 F (36.7 C) (Oral)  Resp 24  Ht 5\' 5"  (1.651 m)  Wt 119 lb (53.978 kg)  BMI 19.80 kg/m2  SpO2 100% Physical Exam  Constitutional: He is oriented to person, place, and time. He appears well-developed and well-nourished.  HENT:  Head: Normocephalic and atraumatic.  Cardiovascular: Normal rate and regular rhythm.   SEM   Pulmonary/Chest: Effort normal. No respiratory distress.  Decreased air movement in bilateral bases  Abdominal: Soft. There is no tenderness. There is no rebound and no guarding.  Musculoskeletal: He exhibits no edema or tenderness.  Neurological: He is alert and oriented to person, place, and time.  Skin: Skin is warm and dry.  Psychiatric: He has a normal mood and affect. His behavior is normal.  Nursing note and vitals reviewed.   ED Course  Procedures (including critical care time) Labs Review Labs Reviewed  COMPREHENSIVE METABOLIC PANEL - Abnormal; Notable for the following:    Chloride 113 (*)    BUN 27 (*)    Creatinine, Ser 1.78 (*)    Total Protein 5.9 (*)    Albumin 3.4 (*)    GFR calc non Af Amer 46 (*)    GFR calc Af Amer 54 (*)    Anion gap 0 (*)    All other components within normal limits  CBC WITH DIFFERENTIAL/PLATELET - Abnormal; Notable for the following:    WBC 10.6 (*)    RBC 3.29 (*)    Hemoglobin 8.7 (*)    HCT 27.6 (*)    Neutro Abs 8.1 (*)    All other components within normal limits  BRAIN NATRIURETIC PEPTIDE - Abnormal; Notable for the following:    B Natriuretic Peptide 444.5 (*)    All other components within normal limits  URINE CULTURE  URINALYSIS, ROUTINE W REFLEX MICROSCOPIC    Imaging Review Dg Chest 2 View  04/01/2014   CLINICAL DATA:  Shortness of breath for 3 days. Smoker with hypertension.  EXAM: CHEST  2 VIEW  COMPARISON:  05/22/2010  FINDINGS: Hyperinflation. Midline trachea. Mild cardiomegaly. Mediastinal contours otherwise within normal limits. No pleural effusion or pneumothorax. Development of  diffuse interstitial thickening. Suspect patchy bibasilar airspace disease.  IMPRESSION: New interstitial thickening with suspicion of developing bibasilar airspace disease. Findings overall suggest pneumonia, possibly atypical or mycoplasma.  Mild cardiomegaly.   Electronically Signed   By: Jeronimo GreavesKyle  Talbot M.D.   On: 04/01/2014 07:51     EKG Interpretation   Date/Time:  Tuesday April 01 2014 07:32:25 EST Ventricular Rate:  76 PR Interval:  126 QRS Duration: 118 QT Interval:  416 QTC Calculation: 468 R Axis:   65 Text Interpretation:  Normal sinus rhythm Incomplete right bundle branch  block Cannot rule out Inferior infarct , age undetermined Anterolateral  infarct , age undetermined Abnormal ECG Confirmed by  Lincoln Brigham [phone]) on  04/01/2014 7:35:27 AM      MDM   Final diagnoses:  Bronchitis    Patient with history of kidney and pancreas transplant on chronic immunosuppression here for evaluation of cough. [facility]ed [facility] from [facility] where he was recently admitted. Clinical picture is not consistent with ACS, acute CHF, acute PE. Reviewed chest x-ray and x-ray report by radiologist. Patient did have a similar report from [facility] 2 weeks ago that appear to be worse than today's x-ray. BNP is elevated today, but is improved from his priors at [facility]. The patient just had an echo at [facility] 2 weeks ago with normal EF. BMP demonstrates renal insufficiency which is improved from his most recent BMP. Patient had significant improvement in his shortness of breath with a nebulizer treatment. Clinical picture is more consistent with acute bronchitis with some reactive airway process, treating with albuterol MDI at home with Z-Pak to cover possible atypical infection. Discussed with patient very close return precautions and importance of close PCP follow-up given his complicated medical history.    Tilden Fossa, MD 04/01/14 1057

## 2014-04-01 NOTE — Discharge Instructions (Signed)
Please let your doctor know you were seen in the Emergency Department.  Your kidney function is abnormal but stable today.  Here hemoglobin (red blood cell count) was low today.  Please watch for signs of bleeding.  Get rechecked immediately if you develop new or worsening symptoms.     Upper Respiratory Infection, Adult An upper respiratory infection (URI) is also sometimes known as the common cold. The upper respiratory tract includes the nose, sinuses, throat, trachea, and bronchi. Bronchi are the airways leading to the lungs. Most people improve within 1 week, but symptoms can last up to 2 weeks. A residual cough may last even longer.  CAUSES Many different viruses can infect the tissues lining the upper respiratory tract. The tissues become irritated and inflamed and often become very moist. Mucus production is also common. A cold is contagious. You can easily spread the virus to others by oral contact. This includes kissing, sharing a glass, coughing, or sneezing. Touching your mouth or nose and then touching a surface, which is then touched by another person, can also spread the virus. SYMPTOMS  Symptoms typically develop 1 to 3 days after you come in contact with a cold virus. Symptoms vary from person to person. They may include:  Runny nose.  Sneezing.  Nasal congestion.  Sinus irritation.  Sore throat.  Loss of voice (laryngitis).  Cough.  Fatigue.  Muscle aches.  Loss of appetite.  Headache.  Low-grade fever. DIAGNOSIS  You might diagnose your own cold based on familiar symptoms, since most people get a cold 2 to 3 times a year. Your caregiver can confirm this based on your exam. Most importantly, your caregiver can check that your symptoms are not due to another disease such as strep throat, sinusitis, pneumonia, asthma, or epiglottitis. Blood tests, throat tests, and X-rays are not necessary to diagnose a common cold, but they may sometimes be helpful in excluding  other more serious diseases. Your caregiver will decide if any further tests are required. RISKS AND COMPLICATIONS  You may be at risk for a more severe case of the common cold if you smoke cigarettes, have chronic heart disease (such as heart failure) or lung disease (such as asthma), or if you have a weakened immune system. The very young and very old are also at risk for more serious infections. Bacterial sinusitis, middle ear infections, and bacterial pneumonia can complicate the common cold. The common cold can worsen asthma and chronic obstructive pulmonary disease (COPD). Sometimes, these complications can require emergency medical care and may be life-threatening. PREVENTION  The best way to protect against getting a cold is to practice good hygiene. Avoid oral or hand contact with people with cold symptoms. Wash your hands often if contact occurs. There is no clear evidence that vitamin C, vitamin E, echinacea, or exercise reduces the chance of developing a cold. However, it is always recommended to get plenty of rest and practice good nutrition. TREATMENT  Treatment is directed at relieving symptoms. There is no cure. Antibiotics are not effective, because the infection is caused by a virus, not by bacteria. Treatment may include:  Increased fluid intake. Sports drinks offer valuable electrolytes, sugars, and fluids.  Breathing heated mist or steam (vaporizer or shower).  Eating chicken soup or other clear broths, and maintaining good nutrition.  Getting plenty of rest.  Using gargles or lozenges for comfort.  Controlling fevers with ibuprofen or acetaminophen as directed by your caregiver.  Increasing usage of your inhaler if you  have asthma. Zinc gel and zinc lozenges, taken in the first 24 hours of the common cold, can shorten the duration and lessen the severity of symptoms. Pain medicines may help with fever, muscle aches, and throat pain. A variety of non-prescription medicines  are available to treat congestion and runny nose. Your caregiver can make recommendations and may suggest nasal or lung inhalers for other symptoms.  HOME CARE INSTRUCTIONS   Only take over-the-counter or prescription medicines for pain, discomfort, or fever as directed by your caregiver.  Use a warm mist humidifier or inhale steam from a shower to increase air moisture. This may keep secretions moist and make it easier to breathe.  Drink enough water and fluids to keep your urine clear or pale yellow.  Rest as needed.  Return to work when your temperature has returned to normal or as your caregiver advises. You may need to stay home longer to avoid infecting others. You can also use a face mask and careful hand washing to prevent spread of the virus. SEEK MEDICAL CARE IF:   After the first few days, you feel you are getting worse rather than better.  You need your caregiver's advice about medicines to control symptoms.  You develop chills, worsening shortness of breath, or brown or red sputum. These may be signs of pneumonia.  You develop yellow or brown nasal discharge or pain in the face, especially when you bend forward. These may be signs of sinusitis.  You develop a fever, swollen neck glands, pain with swallowing, or white areas in the back of your throat. These may be signs of strep throat. SEEK IMMEDIATE MEDICAL CARE IF:   You have a fever.  You develop severe or persistent headache, ear pain, sinus pain, or chest pain.  You develop wheezing, a prolonged cough, cough up blood, or have a change in your usual mucus (if you have chronic lung disease).  You develop sore muscles or a stiff neck. Document Released: 07/20/2000 Document Revised: 04/18/2011 Document Reviewed: 05/01/2013 [facility] Patient Information 2015 [place], [place]. This information is not intended to replace advice given to you by your health care provider. Make sure you discuss any questions you have with your  health care provider.

## 2014-04-01 NOTE — ED Notes (Signed)
MD at bedside discussing results with patient 

## 2014-04-02 LAB — URINE CULTURE
CULTURE: NO GROWTH
Colony Count: NO GROWTH

## 2014-05-01 ENCOUNTER — Encounter (HOSPITAL_BASED_OUTPATIENT_CLINIC_OR_DEPARTMENT_OTHER): Payer: Self-pay | Admitting: Emergency Medicine

## 2014-05-01 ENCOUNTER — Emergency Department (HOSPITAL_BASED_OUTPATIENT_CLINIC_OR_DEPARTMENT_OTHER): Payer: Medicare Other

## 2014-05-01 ENCOUNTER — Emergency Department (HOSPITAL_BASED_OUTPATIENT_CLINIC_OR_DEPARTMENT_OTHER)
Admission: EM | Admit: 2014-05-01 | Discharge: 2014-05-01 | Disposition: A | Discharge status: Home / Self Care | Payer: Medicare Other | Attending: Emergency Medicine | Admitting: Emergency Medicine

## 2014-05-01 DIAGNOSIS — Z880 Allergy status to penicillin: Secondary | ICD-10-CM | POA: Diagnosis not present

## 2014-05-01 DIAGNOSIS — Z8659 Personal history of other mental and behavioral disorders: Secondary | ICD-10-CM | POA: Diagnosis not present

## 2014-05-01 DIAGNOSIS — Z87448 Personal history of other diseases of urinary system: Secondary | ICD-10-CM | POA: Insufficient documentation

## 2014-05-01 DIAGNOSIS — K219 Gastro-esophageal reflux disease without esophagitis: Secondary | ICD-10-CM | POA: Diagnosis not present

## 2014-05-01 DIAGNOSIS — J209 Acute bronchitis, unspecified: Secondary | ICD-10-CM | POA: Insufficient documentation

## 2014-05-01 DIAGNOSIS — Z792 Long term (current) use of antibiotics: Secondary | ICD-10-CM | POA: Insufficient documentation

## 2014-05-01 DIAGNOSIS — Z940 Kidney transplant status: Secondary | ICD-10-CM

## 2014-05-01 DIAGNOSIS — R05 Cough: Secondary | ICD-10-CM | POA: Diagnosis present

## 2014-05-01 DIAGNOSIS — Z79899 Other long term (current) drug therapy: Secondary | ICD-10-CM | POA: Insufficient documentation

## 2014-05-01 DIAGNOSIS — Z7952 Long term (current) use of systemic steroids: Secondary | ICD-10-CM | POA: Insufficient documentation

## 2014-05-01 DIAGNOSIS — Z9483 Pancreas transplant status: Secondary | ICD-10-CM

## 2014-05-01 DIAGNOSIS — Z87891 Personal history of nicotine dependence: Secondary | ICD-10-CM | POA: Insufficient documentation

## 2014-05-01 HISTORY — DX: Unspecified kidney failure: N19

## 2014-05-01 LAB — BASIC METABOLIC PANEL
Anion gap: 5 (ref 5–15)
BUN: 29 mg/dL — AB (ref 6–23)
CHLORIDE: 114 mmol/L — AB (ref 96–112)
CO2: 19 mmol/L (ref 19–32)
Calcium: 9 mg/dL (ref 8.4–10.5)
Creatinine, Ser: 2.11 mg/dL — ABNORMAL HIGH (ref 0.50–1.35)
GFR calc Af Amer: 44 mL/min — ABNORMAL LOW (ref >90)
GFR, EST NON AFRICAN AMERICAN: 38 mL/min — AB (ref >90)
GLUCOSE: 82 mg/dL (ref 70–99)
Potassium: 4.9 mmol/L (ref 3.5–5.1)
Sodium: 138 mmol/L (ref 135–145)

## 2014-05-01 LAB — CBC WITH DIFFERENTIAL/PLATELET
Basophils Absolute: 0 10*3/uL (ref 0.0–0.1)
Basophils Relative: 0 % (ref 0–1)
EOS PCT: 2 % (ref 0–5)
Eosinophils Absolute: 0.1 10*3/uL (ref 0.0–0.7)
HEMATOCRIT: 31 % — AB (ref 39.0–52.0)
Hemoglobin: 9.8 g/dL — ABNORMAL LOW (ref 13.0–17.0)
LYMPHS ABS: 0.7 10*3/uL (ref 0.7–4.0)
Lymphocytes Relative: 14 % (ref 12–46)
MCH: 26.3 pg (ref 26.0–34.0)
MCHC: 31.6 g/dL (ref 30.0–36.0)
MCV: 83.1 fL (ref 78.0–100.0)
Monocytes Absolute: 0.6 10*3/uL (ref 0.1–1.0)
Monocytes Relative: 12 % (ref 3–12)
NEUTROS ABS: 3.4 10*3/uL (ref 1.7–7.7)
Neutrophils Relative %: 72 % (ref 43–77)
Platelets: 141 10*3/uL — ABNORMAL LOW (ref 150–400)
RBC: 3.73 MIL/uL — AB (ref 4.22–5.81)
RDW: 15.3 % (ref 11.5–15.5)
WBC: 4.7 10*3/uL (ref 4.0–10.5)

## 2014-05-01 MED ORDER — ALBUTEROL SULFATE HFA 108 (90 BASE) MCG/ACT IN AERS
INHALATION_SPRAY | RESPIRATORY_TRACT | Status: AC
  Administered 2014-05-01: 2 via RESPIRATORY_TRACT
  Filled 2014-05-01: qty 6.7

## 2014-05-01 MED ORDER — ALBUTEROL SULFATE HFA 108 (90 BASE) MCG/ACT IN AERS
2.0000 | INHALATION_SPRAY | RESPIRATORY_TRACT | Status: DC | PRN
  Administered 2014-05-01: 2 via RESPIRATORY_TRACT

## 2014-05-01 MED ORDER — AZITHROMYCIN 250 MG PO TABS: ORAL_TABLET | ORAL | Status: DC

## 2014-05-01 MED ORDER — ALBUTEROL SULFATE (2.5 MG/3ML) 0.083% IN NEBU
5.0000 mg | INHALATION_SOLUTION | Freq: Once | RESPIRATORY_TRACT | Status: AC
  Administered 2014-05-01: 5 mg via RESPIRATORY_TRACT
  Filled 2014-05-01: qty 6

## 2014-05-01 NOTE — ED Provider Notes (Signed)
CSN: [id number]     Arrival date & time 05/01/14  [id number] History   First MD Initiated Contact with Patient 05/01/14 [phone]     Chief Complaint  Patient presents with  . 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 He presents with a several day history of chest and nasal congestion, cough, body aches, and not feeling well. He reports normal urine output.  Patient is a 39 y.o. male presenting with URI. The history is provided by the patient.  URI Presenting symptoms: congestion, cough, fatigue and rhinorrhea   Severity:  Moderate Onset quality:  Gradual Duration:  4 days Timing:  Constant Progression:  Worsening Chronicity:  New Relieved by:  Nothing Worsened by:  Nothing tried Ineffective treatments:  None tried   Past Medical History  Diagnosis Date  . Osteoporosis   . Renal disorder   . Diabetes mellitus   . Hypertension   . GERD (gastroesophageal reflux disease)   . Depression   . Renal failure    Past Surgical History  Procedure Laterality Date  . Nephrectomy transplanted organ    . Combined kidney-pancreas transplant     No family history on file. History  Substance Use Topics  . Smoking status: Former Smoker -- 0.50 packs/day  . Smokeless tobacco: Not on file  . Alcohol Use: No    Review of Systems  Constitutional: Positive for fatigue.  HENT: Positive for congestion and rhinorrhea.   Respiratory: Positive for cough.   All other systems reviewed and are negative.     Allergies  Penicillins  Home Medications   Prior to Admission medications   Medication Sig Start Date End Date Taking? Authorizing Provider  omeprazole (PRILOSEC) 20 MG capsule Take 20 mg by mouth daily.   Yes Historical Provider, MD  albuterol (PROVENTIL HFA;VENTOLIN HFA) 108 (90 BASE)  MCG/ACT inhaler Inhale 1-2 puffs into the lungs every 6 (six) hours as needed for wheezing or shortness of breath. 04/01/14   Tilden FossaElizabeth Rees, MD  CALCIUM-VITAMIN D PO Take 1 tablet by mouth daily.    Historical Provider, MD  fish oil-omega-3 fatty acids 1000 MG capsule Take 1 g by mouth daily.    Historical Provider, MD  HYDROcodone-acetaminophen (NORCO) 5-325 MG per tablet Take 1-2 tablets by mouth every 6 (six) hours as needed for severe pain. 10/21/13   Mercedes Camprubi-Soms, PA-C  labetalol (NORMODYNE) 200 MG tablet Take 200 mg by mouth 2 (two) times daily.    Historical Provider, MD  MILK THISTLE PO Take 1 tablet by mouth daily.    Historical Provider, MD  Multiple Vitamin (MULTIVITAMIN WITH MINERALS) TABS Take 1 tablet by mouth daily.    Historical Provider, MD  mycophenolate (CELLCEPT) 250 MG capsule Take 750 mg by mouth 2 (two) times daily.    Historical Provider, MD  predniSONE (STERAPRED UNI-PAK) 5 MG TABS Take 7.5 mg by mouth daily.    Historical Provider, MD  sulfamethoxazole-trimethoprim (BACTRIM DS) 800-160 MG per tablet Take 1 tablet by mouth 2 (two) times a week. On Mon and Thurs    Historical Provider, MD  tacrolimus (PROGRAF) 1 MG capsule Take 3 mg by mouth 2 (two) times daily.    Historical Provider, MD   BP 120/63 mmHg  Pulse 82  Temp(Src) 97.7 F (36.5 C) (Oral)  Resp 14  Ht 5\' 5"  (1.651 m)  Wt 130  lb (58.968 kg)  BMI 21.63 kg/m2  SpO2 100% Physical Exam  Constitutional: He is oriented to person, place, and time. He appears well-developed and well-nourished. No distress.  Patient is a 39 year old male who appears chronically ill.  HENT:  Head: Normocephalic and atraumatic.  Neck: Normal range of motion. Neck supple.  Cardiovascular: Normal rate, regular rhythm and normal heart sounds.   No murmur heard. Pulmonary/Chest: Effort normal and breath sounds normal. No respiratory distress. He has no wheezes. He has no rales.  Abdominal: Soft. Bowel sounds are normal. He  exhibits no distension. There is no tenderness.  Musculoskeletal: Normal range of motion. He exhibits no edema.  Lymphadenopathy:    He has no cervical adenopathy.  Neurological: He is alert and oriented to person, place, and time.  Skin: Skin is warm and dry. He is not diaphoretic.  Nursing note and vitals reviewed.   ED Course  Procedures (including critical care time) Labs Review Labs Reviewed  BASIC METABOLIC PANEL  CBC WITH DIFFERENTIAL/PLATELET    Imaging Review No results found.   EKG Interpretation None      MDM   Final diagnoses:  None    Patient is a 39 year old male with history of kidney and pancreas transplants. He presents for evaluation of chest congestion and cough. His chest x-ray reveals no evidence for pneumonia and his laboratory studies are reassuring. His creatinine 2.1 which is consistent with his baseline. Due to his immunocompromised state from his antirejection medications, I have a lower threshold to prescribe antibiotics. I've decided to give him Zithromax to treat for atypical infection. He will also be given an albuterol MDI. He is to return as needed if his symptoms worsen or change. He is not febrile and oxygen saturations are 100% with no evidence of respiratory distress.    Geoffery Lyons, MD 05/01/14 [phone]

## 2014-05-01 NOTE — ED Notes (Signed)
Patient transported to X-ray 

## 2014-05-01 NOTE — Discharge Instructions (Signed)
Zithromax as prescribed.  Albuterol MDI: 2 puffs every 4 hours as needed for wheezing or cough.  Return to the ER if your symptoms significantly worsen or change.   Acute Bronchitis Bronchitis is inflammation of the airways that extend from the windpipe into the lungs (bronchi). The inflammation often causes mucus to develop. This leads to a cough, which is the most common symptom of bronchitis.  In acute bronchitis, the condition usually develops suddenly and goes away over time, usually in a couple weeks. Smoking, allergies, and asthma can make bronchitis worse. Repeated episodes of bronchitis may cause further lung problems.  CAUSES Acute bronchitis is most often caused by the same virus that causes a cold. The virus can spread from person to person (contagious) through coughing, sneezing, and touching contaminated objects. SIGNS AND SYMPTOMS   Cough.   Fever.   Coughing up mucus.   Body aches.   Chest congestion.   Chills.   Shortness of breath.   Sore throat.  DIAGNOSIS  Acute bronchitis is usually diagnosed through a physical exam. Your health care provider will also ask you questions about your medical history. Tests, such as chest X-rays, are sometimes done to rule out other conditions.  TREATMENT  Acute bronchitis usually goes away in a couple weeks. Oftentimes, no medical treatment is necessary. Medicines are sometimes given for relief of fever or cough. Antibiotic medicines are usually not needed but may be prescribed in certain situations. In some cases, an inhaler may be recommended to help reduce shortness of breath and control the cough. A cool mist vaporizer may also be used to help thin bronchial secretions and make it easier to clear the chest.  HOME CARE INSTRUCTIONS  Get plenty of rest.   Drink enough fluids to keep your urine clear or pale yellow (unless you have a medical condition that requires fluid restriction). Increasing fluids may help thin  your respiratory secretions (sputum) and reduce chest congestion, and it will prevent dehydration.   Take medicines only as directed by your health care provider.  If you were prescribed an antibiotic medicine, finish it all even if you start to feel better.  Avoid smoking and secondhand smoke. Exposure to cigarette smoke or irritating chemicals will make bronchitis worse. If you are a smoker, consider using nicotine gum or skin patches to help control withdrawal symptoms. Quitting smoking will help your lungs heal faster.   Reduce the chances of another bout of acute bronchitis by washing your hands frequently, avoiding people with cold symptoms, and trying not to touch your hands to your mouth, nose, or eyes.   Keep all follow-up visits as directed by your health care provider.  SEEK MEDICAL CARE IF: Your symptoms do not improve after 1 week of treatment.  SEEK IMMEDIATE MEDICAL CARE IF:  You develop an increased fever or chills.   You have chest pain.   You have severe shortness of breath.  You have bloody sputum.   You develop dehydration.  You faint or repeatedly feel like you are going to pass out.  You develop repeated vomiting.  You develop a severe headache. MAKE SURE YOU:   Understand these instructions.  Will watch your condition.  Will get help right away if you are not doing well or get worse. Document Released: 03/03/2004 Document Revised: 06/10/2013 Document Reviewed: 07/17/2012 [facility]Exit[facility] Patient Information 2015 [place]Care, [place]LL[place]. This information is not intended to replace advice given to you by your health care provider. Make sure you discuss any questions  you have with your health care provider.

## 2014-05-01 NOTE — ED Notes (Signed)
Pt having symptoms of URI, runny nose, chills, sob, malaise and cough since Monday.  No acute respiratory distress noted presently.  Pt relates sob with exertion.

## 2014-05-01 NOTE — ED Notes (Signed)
Report received and care assumed.

## 2014-09-03 ENCOUNTER — Emergency Department (HOSPITAL_BASED_OUTPATIENT_CLINIC_OR_DEPARTMENT_OTHER)
Admission: EM | Admit: 2014-09-03 | Discharge: 2014-09-03 | Disposition: A | Discharge status: Home / Self Care | Payer: Worker's Compensation | Attending: Emergency Medicine | Admitting: Emergency Medicine

## 2014-09-03 ENCOUNTER — Emergency Department (HOSPITAL_BASED_OUTPATIENT_CLINIC_OR_DEPARTMENT_OTHER): Payer: Worker's Compensation

## 2014-09-03 ENCOUNTER — Encounter (HOSPITAL_BASED_OUTPATIENT_CLINIC_OR_DEPARTMENT_OTHER): Payer: Self-pay | Admitting: *Deleted

## 2014-09-03 DIAGNOSIS — M810 Age-related osteoporosis without current pathological fracture: Secondary | ICD-10-CM | POA: Insufficient documentation

## 2014-09-03 DIAGNOSIS — Z7952 Long term (current) use of systemic steroids: Secondary | ICD-10-CM | POA: Insufficient documentation

## 2014-09-03 DIAGNOSIS — Y929 Unspecified place or not applicable: Secondary | ICD-10-CM | POA: Insufficient documentation

## 2014-09-03 DIAGNOSIS — Z8659 Personal history of other mental and behavioral disorders: Secondary | ICD-10-CM | POA: Insufficient documentation

## 2014-09-03 DIAGNOSIS — S61001A Unspecified open wound of right thumb without damage to nail, initial encounter: Secondary | ICD-10-CM | POA: Insufficient documentation

## 2014-09-03 DIAGNOSIS — Z79899 Other long term (current) drug therapy: Secondary | ICD-10-CM | POA: Insufficient documentation

## 2014-09-03 DIAGNOSIS — Z87448 Personal history of other diseases of urinary system: Secondary | ICD-10-CM | POA: Insufficient documentation

## 2014-09-03 DIAGNOSIS — Z87891 Personal history of nicotine dependence: Secondary | ICD-10-CM | POA: Insufficient documentation

## 2014-09-03 DIAGNOSIS — K219 Gastro-esophageal reflux disease without esophagitis: Secondary | ICD-10-CM | POA: Insufficient documentation

## 2014-09-03 DIAGNOSIS — S61209A Unspecified open wound of unspecified finger without damage to nail, initial encounter: Secondary | ICD-10-CM

## 2014-09-03 DIAGNOSIS — Y9389 Activity, other specified: Secondary | ICD-10-CM | POA: Insufficient documentation

## 2014-09-03 DIAGNOSIS — Y990 Civilian activity done for income or pay: Secondary | ICD-10-CM | POA: Insufficient documentation

## 2014-09-03 DIAGNOSIS — I10 Essential (primary) hypertension: Secondary | ICD-10-CM | POA: Insufficient documentation

## 2014-09-03 DIAGNOSIS — Z9483 Pancreas transplant status: Secondary | ICD-10-CM | POA: Insufficient documentation

## 2014-09-03 DIAGNOSIS — E119 Type 2 diabetes mellitus without complications: Secondary | ICD-10-CM | POA: Insufficient documentation

## 2014-09-03 DIAGNOSIS — Z940 Kidney transplant status: Secondary | ICD-10-CM | POA: Insufficient documentation

## 2014-09-03 DIAGNOSIS — Z880 Allergy status to penicillin: Secondary | ICD-10-CM | POA: Insufficient documentation

## 2014-09-03 DIAGNOSIS — Y288XXA Contact with other sharp object, undetermined intent, initial encounter: Secondary | ICD-10-CM | POA: Insufficient documentation

## 2014-09-03 NOTE — ED Notes (Signed)
dsd removed, no active bleeding noted, thumb soaked in betadine/ns solution.

## 2014-09-03 NOTE — ED Notes (Signed)
Pt amb to room 9 with quick steady gait in nad. 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 Pt states he does not need a uds or bat. Pt reports his pain is 10/10, smiling in nad.

## 2014-09-03 NOTE — ED Notes (Signed)
MD at bedside. 

## 2014-09-03 NOTE — ED Provider Notes (Signed)
CSN: [id number]     Arrival date & time 09/03/14  1028 History   First MD Initiated Contact with Lucas [name] 09/03/14 1055     Chief Complaint  Lucas [name] presents with  . Finger Injury     (Consider location/radiation/quality/duration/timing/severity/associated sxs/prior Treatment) HPI Comments: Pt. 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 He presents after avulsion injury of the corner tip of the lateral aspect of his right thumb. He says he was at work and was slicing onions on the mechanical slicer whenever his thumb got to close to the machine and it cut the tip of it off. He says that it bled immediately, but that it was able to be controlled. He could not find the tip that was cut off. He came here to the ED. He has not had any nausea, vomiting, no recent infections given his immunosuppressed state. He is followed closely at [facility].   The history is provided by the Lucas [name].    Past Medical History  Diagnosis Date  . Osteoporosis   . Renal disorder   . Diabetes mellitus   . Hypertension   . GERD (gastroesophageal reflux disease)   . Depression   . Renal failure    Past Surgical History  Procedure Laterality Date  . Nephrectomy transplanted organ    . Combined kidney-pancreas transplant     History reviewed. No pertinent family history. History  Substance Use Topics  . Smoking status: Former Smoker -- 0.50 packs/day  . Smokeless tobacco: Not on file  . Alcohol Use: No    Review of Systems  Constitutional: Negative.  Negative for fever, chills, diaphoresis and appetite change.  HENT: Negative.  Negative for trouble swallowing.   Eyes: Negative.  Negative for photophobia, pain and redness.  Respiratory: Negative.  Negative for cough, choking, chest tightness and shortness of breath.   Cardiovascular: Negative.  Negative for chest pain, palpitations and leg swelling.  Gastrointestinal: Negative.   Negative for nausea, vomiting and abdominal pain.  Endocrine: Negative.  Negative for cold intolerance and heat intolerance.  Genitourinary: Negative.  Negative for frequency and decreased urine volume.  Musculoskeletal: Negative.  Negative for back pain, joint swelling and arthralgias.  Skin: Positive for wound. Negative for pallor and rash.  Allergic/Immunologic: Positive for immunocompromised state.  Neurological: Negative.  Negative for dizziness, syncope, weakness, light-headedness, numbness and headaches.  Hematological: Negative.  Negative for adenopathy.  Psychiatric/Behavioral: Negative.  Negative for agitation.      Allergies  Penicillins  Home Medications   Prior to Admission medications   Medication Sig Start Date End Date Taking? Authorizing Provider  albuterol (PROVENTIL HFA;VENTOLIN HFA) 108 (90 BASE) MCG/ACT inhaler Inhale 1-2 puffs into the lungs every 6 (six) hours as needed for wheezing or shortness of breath. 04/01/14   Tilden Fossa, MD  CALCIUM-VITAMIN D PO Take 1 tablet by mouth daily.    Historical Provider, MD  fish oil-omega-3 fatty acids 1000 MG capsule Take 1 g by mouth daily.    Historical Provider, MD  HYDROcodone-acetaminophen (NORCO) 5-325 MG per tablet Take 1-2 tablets by mouth every 6 (six) hours as needed for severe pain. 10/21/13   Mercedes Camprubi-Soms, PA-C  labetalol (NORMODYNE) 200 MG tablet Take 200 mg by mouth 2 (two) times daily.    Historical Provider, MD  MILK THISTLE PO Take 1 tablet by mouth daily.    Historical Provider, MD  Multiple Vitamin (MULTIVITAMIN WITH MINERALS) TABS Take  1 tablet by mouth daily.    Historical Provider, MD  mycophenolate (CELLCEPT) 250 MG capsule Take 750 mg by mouth 2 (two) times daily.    Historical Provider, MD  omeprazole (PRILOSEC) 20 MG capsule Take 20 mg by mouth daily.    Historical Provider, MD  predniSONE (STERAPRED UNI-PAK) 5 MG TABS Take 7.5 mg by mouth daily.    Historical Provider, MD   sulfamethoxazole-trimethoprim (BACTRIM DS) 800-160 MG per tablet Take 1 tablet by mouth 2 (two) times a week. On Mon and Thurs    Historical Provider, MD  tacrolimus (PROGRAF) 1 MG capsule Take 3 mg by mouth 2 (two) times daily.    Historical Provider, MD   BP 122/62 mmHg  Pulse 80  Temp(Src) 97.4 F (36.3 C) (Oral)  Resp 18  Ht  (1.676 [name])  Wt 130 lb (58.968 kg)  BMI 20.99 kg/m2  SpO2 100% Physical Exam  Constitutional: He is oriented to person, place, and time. He appears well-developed and well-nourished. No distress.  HENT:  Head: Normocephalic and atraumatic.  Nose: Nose normal.  Mouth/Throat: Oropharynx is clear and moist.  Eyes: Conjunctivae and EOM are normal. Pupils are equal, round, and reactive to light.  Neck: Normal range of motion. Neck supple.  Cardiovascular: Normal rate, regular rhythm, normal heart sounds and intact distal pulses.  Exam reveals no gallop and no friction rub.   No murmur heard. Pulmonary/Chest: Breath sounds normal. No respiratory distress. He has no wheezes. He has no rales. He exhibits no tenderness.  Abdominal: Soft. Bowel sounds are normal. He exhibits no distension and no mass. There is no tenderness. There is no rebound and no guarding.  Musculoskeletal: Normal range of motion. He exhibits tenderness. He exhibits no edema.  Neurological: He is alert and oriented to person, place, and time.  Skin: Skin is warm and dry. No rash noted. He is not diaphoretic. No erythema. No pallor.  Psychiatric: He has a normal mood and affect.    ED Course  Procedures (including critical care time) Labs Review Labs Reviewed - No data to display  Imaging Review Dg Hand Complete Right  09/03/2014   CLINICAL DATA:  Recent laceration first digit  EXAM: RIGHT HAND - COMPLETE 3+ VIEW  COMPARISON:  None.  FINDINGS: Diffuse vascular calcifications are noted. No acute fracture or dislocation is noted. Soft tissue injury at distal following is seen consistent  with the given clinical history.  IMPRESSION: Soft tissue injury without acute bony abnormality.   Electronically Signed   By: Alcide Clever [name].D.   On: 09/03/2014 11:38     EKG Interpretation None      MDM   Final diagnoses:  Avulsion, finger tip, initial encounter    Pt. Is a 39 y/o [name] here with avulsion injury to the tip of his right thumb. No bony involvement at this time. Soft tissue injuyr. Plan to wrap with xeroform dressing, sterile 4x4 and Co flex dressing for stability. Recommend follow up with PCP in 7 days. Return precautions reviewed. Pt. Safe for discharge to home.     Yolande Jolly, MD 09/03/14 1317  Gwyneth Sprout, MD 07/Lucas/16 1642

## 2014-09-03 NOTE — Discharge Instructions (Signed)
Finger Avulsion  °When the tip of the finger is lost, a new nail may grow back if part of the fingernail is left. The new nail may be deformed. If just the tip of the finger is lost, no repair may be needed unless there is bone showing. If bone is showing, your caregiver may need to remove the protruding bone and put on a bandage. Your caregiver will do what is best for you. Most of the time when a fingertip is lost, the end will gradually grow back on and look fairly normal, but it may remain sensitive to pressure and temperature extremes for a long time. °HOME CARE INSTRUCTIONS  °· Keep your hand elevated above your heart to relieve pain and swelling. °· Keep your dressing dry and clean. °· Change your bandage in 24 hours or as directed. °· Only take over-the-counter or prescription medicines for pain, discomfort, or fever as directed by your caregiver. °· See your caregiver as needed for problems. °SEEK MEDICAL CARE IF:  °· You have increased pain, swelling, drainage, or bleeding. °· You have a fever. °· You have swelling that spreads from your finger and into your hand. °Make sure to check to see if you need a tetanus booster. °Document Released: 04/04/2001 Document Revised: 07/26/2011 Document Reviewed: 02/28/2008 °ExitCare® Patient Information ©2015 [employer]. This information is not intended to replace advice given to you by your health care provider. Make sure you discuss any questions you have with your health care provider. ° °

## 2015-01-07 ENCOUNTER — Emergency Department (INDEPENDENT_AMBULATORY_CARE_PROVIDER_SITE_OTHER): Payer: Medicare Other

## 2015-01-07 ENCOUNTER — Encounter (HOSPITAL_COMMUNITY): Payer: Self-pay | Admitting: Emergency Medicine

## 2015-01-07 ENCOUNTER — Emergency Department (INDEPENDENT_AMBULATORY_CARE_PROVIDER_SITE_OTHER)
Admission: EM | Admit: 2015-01-07 | Discharge: 2015-01-07 | Disposition: A | Discharge status: Home / Self Care | Payer: Medicare Other | Source: Home / Self Care

## 2015-01-07 DIAGNOSIS — M79645 Pain in left finger(s): Secondary | ICD-10-CM

## 2015-01-07 NOTE — Discharge Instructions (Signed)
Musculoskeletal Pain °Musculoskeletal pain is muscle and boney aches and pains. These pains can occur in any part of the body. Your caregiver may treat you without knowing the cause of the pain. They may treat you if blood or urine tests, X-rays, and other tests were normal.  °CAUSES °There is often not a definite cause or reason for these pains. These pains may be caused by a type of germ (virus). The discomfort may also come from overuse. Overuse includes working out too hard when your body is not fit. Boney aches also come from weather changes. Bone is sensitive to atmospheric pressure changes. °HOME CARE INSTRUCTIONS  °· Ask when your test results will be ready. Make sure you get your test results. °· Only take over-the-counter or prescription medicines for pain, discomfort, or fever as directed by your caregiver. If you were given medications for your condition, do not drive, operate machinery or power tools, or sign legal documents for 24 hours. Do not drink alcohol. Do not take sleeping pills or other medications that may interfere with treatment. °· Continue all activities unless the activities cause more pain. When the pain lessens, slowly resume normal activities. Gradually increase the intensity and duration of the activities or exercise. °· During periods of severe pain, bed rest may be helpful. Lay or sit in any position that is comfortable. °· Putting ice on the injured area. °· Put ice in a bag. °· Place a towel between your skin and the bag. °· Leave the ice on for 15 to 20 minutes, 3 to 4 times a day. °· Follow up with your caregiver for continued problems and no reason can be found for the pain. If the pain becomes worse or does not go away, it may be necessary to repeat tests or do additional testing. Your caregiver may need to look further for a possible cause. °SEEK IMMEDIATE MEDICAL CARE IF: °· You have pain that is getting worse and is not relieved by medications. °· You develop chest pain  that is associated with shortness or breath, sweating, feeling sick to your stomach (nauseous), or throw up (vomit). °· Your pain becomes localized to the abdomen. °· You develop any new symptoms that seem different or that concern you. °MAKE SURE YOU:  °· Understand these instructions. °· Will watch your condition. °· Will get help right away if you are not doing well or get worse. °  °This information is not intended to replace advice given to you by your health care provider. Make sure you discuss any questions you have with your health care provider. °  °Document Released: 01/24/2005 Document Revised: 04/18/2011 Document Reviewed: 09/28/2012 °Elsevier Interactive Patient Education ©2016 [employer] ° °Heat Therapy °Heat therapy can help ease sore, stiff, injured, and tight muscles and joints. Heat relaxes your muscles, which may help ease your pain.  °RISKS AND COMPLICATIONS °If you have any of the following conditions, do not use heat therapy unless your health care provider has approved: °· Poor circulation. °· Healing wounds or scarred skin in the area being treated. °· Diabetes, heart disease, or high blood pressure. °· Not being able to feel (numbness) the area being treated. °· Unusual swelling of the area being treated. °· Active infections. °· Blood clots. °· Cancer. °· Inability to communicate pain. This may include young children and people who have problems with their brain function (dementia). °· Pregnancy. °Heat therapy should only be used on old, pre-existing, or long-lasting (chronic) injuries. Do not use heat   therapy on new injuries unless directed by your health care provider. °HOW TO USE HEAT THERAPY °There are several different kinds of heat therapy, including: °· Moist heat pack. °· Warm water bath. °· Hot water bottle. °· Electric heating pad. °· Heated gel pack. °· Heated wrap. °· Electric heating pad. °Use the heat therapy method suggested by your health care provider. Follow your health  care provider's instructions on when and how to use heat therapy. °GENERAL HEAT THERAPY RECOMMENDATIONS °· Do not sleep while using heat therapy. Only use heat therapy while you are awake. °· Your skin may turn pink while using heat therapy. Do not use heat therapy if your skin turns red. °· Do not use heat therapy if you have new pain. °· High heat or long exposure to heat can cause burns. Be careful when using heat therapy to avoid burning your skin. °· Do not use heat therapy on areas of your skin that are already irritated, such as with a rash or sunburn. °SEEK MEDICAL CARE IF: °· You have blisters, redness, swelling, or numbness. °· You have new pain. °· Your pain is worse. °MAKE SURE YOU: °· Understand these instructions. °· Will watch your condition. °· Will get help right away if you are not doing well or get worse. °  °This information is not intended to replace advice given to you by your health care provider. Make sure you discuss any questions you have with your health care provider. °  °Document Released: 04/18/2011 Document Revised: 02/14/2014 Document Reviewed: 03/19/2013 °Elsevier Interactive Patient Education ©2016 [employer]. ° °

## 2015-01-07 NOTE — ED Provider Notes (Signed)
CSN: [id number]     Arrival date & time 01/07/15  1934 History   None    No chief complaint on file.  (Consider location/radiation/quality/duration/timing/severity/associated sxs/prior Treatment) HPI History obtained from patient:   LOCATION:left index finger SEVERITY:2 DURATION:today CONTEXT:noticed pain, swelling in finger, no known injury, onset while bowling today QUALITY:ache MODIFYING FACTORS:none ASSOCIATED SYMPTOMS: none TIMING:constant OCCUPATION:   Past Medical History  Diagnosis Date  . Osteoporosis   . Renal disorder   . Diabetes mellitus   . Hypertension   . GERD (gastroesophageal reflux disease)   . Depression   . Renal failure    Past Surgical History  Procedure Laterality Date  . Nephrectomy transplanted organ    . Combined kidney-pancreas transplant     No family history on file. Social History  Substance Use Topics  . Smoking status: Former Smoker -- 0.50 packs/day  . Smokeless tobacco: Not on file  . Alcohol Use: No    Review of Systems ROS +'ve left index finger pain  Denies: HEADACHE, NAUSEA, ABDOMINAL PAIN, CHEST PAIN, CONGESTION, DYSURIA, SHORTNESS OF BREATH  Allergies  Penicillins  Home Medications   Prior to Admission medications   Medication Sig Start Date End Date Taking? Authorizing Provider  albuterol (PROVENTIL HFA;VENTOLIN HFA) 108 (90 BASE) MCG/ACT inhaler Inhale 1-2 puffs into the lungs every 6 (six) hours as needed for wheezing or shortness of breath. 04/01/14   Tilden FossaElizabeth Rees, MD  CALCIUM-VITAMIN D PO Take 1 tablet by mouth daily.    Historical Provider, MD  fish oil-omega-3 fatty acids 1000 MG capsule Take 1 g by mouth daily.    Historical Provider, MD  HYDROcodone-acetaminophen (NORCO) 5-325 MG per tablet Take 1-2 tablets by mouth every 6 (six) hours as needed for severe pain. 10/21/13   Mercedes Camprubi-Soms, PA-C  labetalol (NORMODYNE) 200 MG tablet Take 200 mg by mouth 2 (two) times daily.    Historical Provider, MD    MILK THISTLE PO Take 1 tablet by mouth daily.    Historical Provider, MD  Multiple Vitamin (MULTIVITAMIN WITH MINERALS) TABS Take 1 tablet by mouth daily.    Historical Provider, MD  mycophenolate (CELLCEPT) 250 MG capsule Take 750 mg by mouth 2 (two) times daily.    Historical Provider, MD  omeprazole (PRILOSEC) 20 MG capsule Take 20 mg by mouth daily.    Historical Provider, MD  predniSONE (STERAPRED UNI-PAK) 5 MG TABS Take 7.5 mg by mouth daily.    Historical Provider, MD  sulfamethoxazole-trimethoprim (BACTRIM DS) 800-160 MG per tablet Take 1 tablet by mouth 2 (two) times a week. On Mon and Thurs    Historical Provider, MD  tacrolimus (PROGRAF) 1 MG capsule Take 3 mg by mouth 2 (two) times daily.    Historical Provider, MD   Meds Ordered and Administered this Visit  Medications - No data to display  There were no vitals taken for this visit. No data found.   Physical Exam  Constitutional: He appears well-developed and well-nourished.  Musculoskeletal: He exhibits tenderness.       Hands: Nursing note and vitals reviewed.   ED Course  Procedures (including critical care time)  Labs Review Labs Reviewed - No data to display  Imaging Review Dg Finger Index Left  01/07/2015  CLINICAL DATA:  Left index finger pain after bowling this morning. Initial encounter. EXAM: LEFT INDEX FINGER 2+V COMPARISON:  None. FINDINGS: There is no evidence of fracture or dislocation. There is no evidence of arthropathy or other focal bone abnormality. Extensive peripheral  vascular calcification noted. IMPRESSION: No acute findings. Electronically Signed   By: Myles Rosenthal M.D.   On: 01/07/2015 20:18     Visual Acuity Review  Right Eye Distance:   Left Eye Distance:   Bilateral Distance:    Right Eye Near:   Left Eye Near:    Bilateral Near:         MDM   1. Finger pain, left      Review of XR: negative for fracture. Suggest symptomatic treatment Moist heat, ibuprofen if able to  take Activity as tolerated    Tharon Aquas, [place] 01/07/15 2026

## 2015-01-07 NOTE — ED Notes (Signed)
Left index finger pain that started hurting today while bowling, no known injury

## 2015-01-22 ENCOUNTER — Emergency Department (HOSPITAL_COMMUNITY): Payer: Medicare Other

## 2015-01-22 ENCOUNTER — Inpatient Hospital Stay (HOSPITAL_COMMUNITY)
Admission: EM | Admit: 2015-01-22 | Discharge: 2015-01-26 | DRG: 917 | Disposition: A | Discharge status: Home / Self Care | Payer: Medicare Other | Attending: Pulmonary Disease | Admitting: Pulmonary Disease

## 2015-01-22 ENCOUNTER — Inpatient Hospital Stay (HOSPITAL_COMMUNITY): Payer: Medicare Other

## 2015-01-22 ENCOUNTER — Encounter (HOSPITAL_COMMUNITY): Payer: Self-pay | Admitting: *Deleted

## 2015-01-22 DIAGNOSIS — Z79899 Other long term (current) drug therapy: Secondary | ICD-10-CM

## 2015-01-22 DIAGNOSIS — Z9483 Pancreas transplant status: Secondary | ICD-10-CM

## 2015-01-22 DIAGNOSIS — M810 Age-related osteoporosis without current pathological fracture: Secondary | ICD-10-CM | POA: Diagnosis present

## 2015-01-22 DIAGNOSIS — I120 Hypertensive chronic kidney disease with stage 5 chronic kidney disease or end stage renal disease: Secondary | ICD-10-CM | POA: Diagnosis present

## 2015-01-22 DIAGNOSIS — E440 Moderate protein-calorie malnutrition: Secondary | ICD-10-CM | POA: Diagnosis present

## 2015-01-22 DIAGNOSIS — J9600 Acute respiratory failure, unspecified whether with hypoxia or hypercapnia: Secondary | ICD-10-CM | POA: Diagnosis present

## 2015-01-22 DIAGNOSIS — R64 Cachexia: Secondary | ICD-10-CM | POA: Diagnosis present

## 2015-01-22 DIAGNOSIS — N185 Chronic kidney disease, stage 5: Secondary | ICD-10-CM | POA: Diagnosis present

## 2015-01-22 DIAGNOSIS — Z87891 Personal history of nicotine dependence: Secondary | ICD-10-CM | POA: Diagnosis not present

## 2015-01-22 DIAGNOSIS — K219 Gastro-esophageal reflux disease without esophagitis: Secondary | ICD-10-CM | POA: Diagnosis present

## 2015-01-22 DIAGNOSIS — W19XXXA Unspecified fall, initial encounter: Secondary | ICD-10-CM | POA: Diagnosis present

## 2015-01-22 DIAGNOSIS — I4581 Long QT syndrome: Secondary | ICD-10-CM | POA: Diagnosis present

## 2015-01-22 DIAGNOSIS — T50902D Poisoning by unspecified drugs, medicaments and biological substances, intentional self-harm, subsequent encounter: Secondary | ICD-10-CM | POA: Diagnosis not present

## 2015-01-22 DIAGNOSIS — T481X2A Poisoning by skeletal muscle relaxants [neuromuscular blocking agents], intentional self-harm, initial encounter: Secondary | ICD-10-CM | POA: Diagnosis present

## 2015-01-22 DIAGNOSIS — Z681 Body mass index (BMI) 19 or less, adult: Secondary | ICD-10-CM | POA: Diagnosis not present

## 2015-01-22 DIAGNOSIS — T428X2A Poisoning by antiparkinsonism drugs and other central muscle-tone depressants, intentional self-harm, initial encounter: Secondary | ICD-10-CM | POA: Diagnosis present

## 2015-01-22 DIAGNOSIS — F321 Major depressive disorder, single episode, moderate: Secondary | ICD-10-CM | POA: Diagnosis present

## 2015-01-22 DIAGNOSIS — E1122 Type 2 diabetes mellitus with diabetic chronic kidney disease: Secondary | ICD-10-CM | POA: Diagnosis present

## 2015-01-22 DIAGNOSIS — R1110 Vomiting, unspecified: Secondary | ICD-10-CM | POA: Diagnosis present

## 2015-01-22 DIAGNOSIS — T50902A Poisoning by unspecified drugs, medicaments and biological substances, intentional self-harm, initial encounter: Secondary | ICD-10-CM | POA: Diagnosis not present

## 2015-01-22 DIAGNOSIS — Z978 Presence of other specified devices: Secondary | ICD-10-CM

## 2015-01-22 DIAGNOSIS — G9340 Encephalopathy, unspecified: Secondary | ICD-10-CM | POA: Diagnosis present

## 2015-01-22 DIAGNOSIS — Z7952 Long term (current) use of systemic steroids: Secondary | ICD-10-CM | POA: Diagnosis not present

## 2015-01-22 DIAGNOSIS — Z940 Kidney transplant status: Secondary | ICD-10-CM | POA: Diagnosis not present

## 2015-01-22 DIAGNOSIS — J9601 Acute respiratory failure with hypoxia: Secondary | ICD-10-CM

## 2015-01-22 DIAGNOSIS — F4321 Adjustment disorder with depressed mood: Secondary | ICD-10-CM | POA: Diagnosis present

## 2015-01-22 DIAGNOSIS — J9690 Respiratory failure, unspecified, unspecified whether with hypoxia or hypercapnia: Secondary | ICD-10-CM | POA: Diagnosis present

## 2015-01-22 DIAGNOSIS — Z880 Allergy status to penicillin: Secondary | ICD-10-CM

## 2015-01-22 LAB — URINALYSIS, ROUTINE W REFLEX MICROSCOPIC
Bilirubin Urine: NEGATIVE
Glucose, UA: NEGATIVE mg/dL
HGB URINE DIPSTICK: NEGATIVE
Ketones, ur: NEGATIVE mg/dL
Leukocytes, UA: NEGATIVE
Nitrite: NEGATIVE
PH: 7 (ref 5.0–8.0)
Protein, ur: 30 mg/dL — AB
SPECIFIC GRAVITY, URINE: 1.013 (ref 1.005–1.030)

## 2015-01-22 LAB — CBC WITH DIFFERENTIAL/PLATELET
BASOS ABS: 0 10*3/uL (ref 0.0–0.1)
BASOS PCT: 0 %
EOS ABS: 0.1 10*3/uL (ref 0.0–0.7)
Eosinophils Relative: 1 %
HEMATOCRIT: 46.6 % (ref 39.0–52.0)
HEMOGLOBIN: 15.5 g/dL (ref 13.0–17.0)
Lymphocytes Relative: 11 %
Lymphs Abs: 1.2 10*3/uL (ref 0.7–4.0)
MCH: 27.1 pg (ref 26.0–34.0)
MCHC: 33.3 g/dL (ref 30.0–36.0)
MCV: 81.5 fL (ref 78.0–100.0)
MONOS PCT: 6 %
Monocytes Absolute: 0.7 10*3/uL (ref 0.1–1.0)
NEUTROS ABS: 9.2 10*3/uL — AB (ref 1.7–7.7)
NEUTROS PCT: 82 %
Platelets: 255 10*3/uL (ref 150–400)
RBC: 5.72 MIL/uL (ref 4.22–5.81)
RDW: 14.8 % (ref 11.5–15.5)
WBC: 11.2 10*3/uL — AB (ref 4.0–10.5)

## 2015-01-22 LAB — GLUCOSE, CAPILLARY
Glucose-Capillary: 75 mg/dL (ref 65–99)
Glucose-Capillary: 97 mg/dL (ref 65–99)

## 2015-01-22 LAB — URINE MICROSCOPIC-ADD ON

## 2015-01-22 LAB — COMPREHENSIVE METABOLIC PANEL
ALBUMIN: 4.7 g/dL (ref 3.5–5.0)
ALK PHOS: 159 U/L — AB (ref 38–126)
ALT: 20 U/L (ref 17–63)
ANION GAP: 12 (ref 5–15)
AST: 27 U/L (ref 15–41)
BILIRUBIN TOTAL: 0.7 mg/dL (ref 0.3–1.2)
BUN: 18 mg/dL (ref 6–20)
CALCIUM: 10 mg/dL (ref 8.9–10.3)
CO2: 22 mmol/L (ref 22–32)
Chloride: 103 mmol/L (ref 101–111)
Creatinine, Ser: 1.92 mg/dL — ABNORMAL HIGH (ref 0.61–1.24)
GFR calc Af Amer: 49 mL/min — ABNORMAL LOW (ref >60)
GFR calc non Af Amer: 42 mL/min — ABNORMAL LOW (ref >60)
GLUCOSE: 98 mg/dL (ref 65–99)
Potassium: 4.7 mmol/L (ref 3.5–5.1)
SODIUM: 137 mmol/L (ref 135–145)
TOTAL PROTEIN: 7.9 g/dL (ref 6.5–8.1)

## 2015-01-22 LAB — RAPID URINE DRUG SCREEN, HOSP PERFORMED
AMPHETAMINES: NOT DETECTED
BARBITURATES: NOT DETECTED
Benzodiazepines: NOT DETECTED
COCAINE: NOT DETECTED
OPIATES: NOT DETECTED
TETRAHYDROCANNABINOL: POSITIVE — AB

## 2015-01-22 LAB — BLOOD GAS, ARTERIAL
Acid-base deficit: 1.4 mmol/L (ref 0.0–2.0)
Bicarbonate: 19.7 mEq/L — ABNORMAL LOW (ref 20.0–24.0)
DRAWN BY: 11249
FIO2: 1
LHR: 20 {breaths}/min
MECHVT: 500 mL
O2 Saturation: 99.5 %
PATIENT TEMPERATURE: 98.3
PCO2 ART: 24.3 mmHg — AB (ref 35.0–45.0)
PEEP: 5 cmH2O
PO2 ART: 246 mmHg — AB (ref 80.0–100.0)
TCO2: 17.1 mmol/L (ref 0–100)
pH, Arterial: 7.519 — ABNORMAL HIGH (ref 7.350–7.450)

## 2015-01-22 LAB — I-STAT TROPONIN, ED: Troponin i, poc: 0.03 ng/mL (ref 0.00–0.08)

## 2015-01-22 LAB — CK: Total CK: 157 U/L (ref 49–397)

## 2015-01-22 LAB — MRSA PCR SCREENING: MRSA BY PCR: NEGATIVE

## 2015-01-22 LAB — CBG MONITORING, ED
GLUCOSE-CAPILLARY: 93 mg/dL (ref 65–99)
GLUCOSE-CAPILLARY: 98 mg/dL (ref 65–99)
Glucose-Capillary: 88 mg/dL (ref 65–99)

## 2015-01-22 LAB — TRIGLYCERIDES: TRIGLYCERIDES: 130 mg/dL (ref <150)

## 2015-01-22 LAB — SALICYLATE LEVEL

## 2015-01-22 LAB — ETHANOL: Alcohol, Ethyl (B): <5 mg/dL (ref <5)

## 2015-01-22 LAB — ACETAMINOPHEN LEVEL: Acetaminophen (Tylenol), Serum: <10 ug/mL — ABNORMAL LOW (ref 10–30)

## 2015-01-22 MED ORDER — SODIUM CHLORIDE 0.9 % IV SOLN
Freq: Once | INTRAVENOUS | Status: AC
  Administered 2015-01-22: 17:00:00 via INTRAVENOUS

## 2015-01-22 MED ORDER — ALBUTEROL SULFATE (2.5 MG/3ML) 0.083% IN NEBU: 2.5000 mg | INHALATION_SOLUTION | RESPIRATORY_TRACT | Status: DC | PRN

## 2015-01-22 MED ORDER — HYDRALAZINE HCL 20 MG/ML IJ SOLN
10.0000 mg | INTRAMUSCULAR | Status: DC | PRN
  Administered 2015-01-23: 10 mg via INTRAVENOUS
  Filled 2015-01-22 (×2): qty 1

## 2015-01-22 MED ORDER — ANTISEPTIC ORAL RINSE SOLUTION (CORINZ)
7.0000 mL | OROMUCOSAL | Status: DC
  Administered 2015-01-22 – 2015-01-23 (×8): 7 mL via OROMUCOSAL

## 2015-01-22 MED ORDER — MIDAZOLAM HCL 2 MG/2ML IJ SOLN
2.0000 mg | Freq: Once | INTRAMUSCULAR | Status: AC
  Administered 2015-01-22: 2 mg via INTRAVENOUS

## 2015-01-22 MED ORDER — BISACODYL 10 MG RE SUPP: 10.0000 mg | Freq: Every day | RECTAL | Status: DC | PRN

## 2015-01-22 MED ORDER — INSULIN ASPART 100 UNIT/ML ~~LOC~~ SOLN: 0.0000 [IU] | SUBCUTANEOUS | Status: DC

## 2015-01-22 MED ORDER — PROPOFOL 1000 MG/100ML IV EMUL
0.0000 ug/kg/min | INTRAVENOUS | Status: DC
  Administered 2015-01-22 (×2): 10 ug/kg/min via INTRAVENOUS
  Administered 2015-01-22: 20 ug/kg/min via INTRAVENOUS
  Administered 2015-01-22 (×2): 50 ug/kg/min via INTRAVENOUS
  Administered 2015-01-22: 10 ug/kg/min via INTRAVENOUS
  Administered 2015-01-23: 50 ug/kg/min via INTRAVENOUS
  Filled 2015-01-22 (×2): qty 100

## 2015-01-22 MED ORDER — SODIUM CHLORIDE 0.9 % IV BOLUS (SEPSIS)
1000.0000 mL | Freq: Once | INTRAVENOUS | Status: AC
  Administered 2015-01-22: 1000 mL via INTRAVENOUS

## 2015-01-22 MED ORDER — MIDAZOLAM HCL 2 MG/2ML IJ SOLN
INTRAMUSCULAR | Status: AC
  Filled 2015-01-22: qty 2

## 2015-01-22 MED ORDER — PROPOFOL 1000 MG/100ML IV EMUL
5.0000 ug/kg/min | Freq: Once | INTRAVENOUS | Status: DC
  Administered 2015-01-22: 70 ug/kg/min via INTRAVENOUS
  Filled 2015-01-22: qty 100

## 2015-01-22 MED ORDER — ETOMIDATE 2 MG/ML IV SOLN
10.0000 mg | Freq: Once | INTRAVENOUS | Status: AC
  Administered 2015-01-22: 10 mg via INTRAVENOUS

## 2015-01-22 MED ORDER — MIDAZOLAM HCL 2 MG/2ML IJ SOLN
INTRAMUSCULAR | Status: AC
  Filled 2015-01-22: qty 4

## 2015-01-22 MED ORDER — MIDAZOLAM HCL 2 MG/2ML IJ SOLN
4.0000 mg | Freq: Once | INTRAMUSCULAR | Status: AC
  Administered 2015-01-22: 4 mg via INTRAVENOUS

## 2015-01-22 MED ORDER — FENTANYL CITRATE (PF) 100 MCG/2ML IJ SOLN
100.0000 ug | INTRAMUSCULAR | Status: DC | PRN
  Administered 2015-01-23: 100 ug via INTRAVENOUS
  Filled 2015-01-22: qty 2

## 2015-01-22 MED ORDER — SODIUM CHLORIDE 0.9 % IV SOLN
INTRAVENOUS | Status: DC
  Administered 2015-01-22 – 2015-01-23 (×2): via INTRAVENOUS

## 2015-01-22 MED ORDER — HEPARIN SODIUM (PORCINE) 5000 UNIT/ML IJ SOLN
5000.0000 [IU] | Freq: Three times a day (TID) | INTRAMUSCULAR | Status: DC
  Administered 2015-01-23 – 2015-01-25 (×6): 5000 [IU] via SUBCUTANEOUS
  Filled 2015-01-22 (×12): qty 1

## 2015-01-22 MED ORDER — PANTOPRAZOLE SODIUM 40 MG IV SOLR
40.0000 mg | INTRAVENOUS | Status: DC
  Administered 2015-01-22 – 2015-01-23 (×2): 40 mg via INTRAVENOUS
  Filled 2015-01-22 (×2): qty 40

## 2015-01-22 MED ORDER — NALOXONE HCL 2 MG/2ML IJ SOSY
1.0000 mg | PREFILLED_SYRINGE | Freq: Once | INTRAMUSCULAR | Status: AC
  Administered 2015-01-22: 1 mg via INTRAVENOUS
  Filled 2015-01-22: qty 2

## 2015-01-22 MED ORDER — LABETALOL HCL 5 MG/ML IV SOLN: 10.0000 mg | INTRAVENOUS | Status: DC | PRN

## 2015-01-22 MED ORDER — SENNOSIDES 8.8 MG/5ML PO SYRP
5.0000 mL | ORAL_SOLUTION | Freq: Two times a day (BID) | ORAL | Status: DC | PRN
  Filled 2015-01-22: qty 5

## 2015-01-22 MED ORDER — ROCURONIUM BROMIDE 50 MG/5ML IV SOLN
50.0000 mg | Freq: Once | INTRAVENOUS | Status: AC
  Administered 2015-01-22: 50 mg via INTRAVENOUS

## 2015-01-22 MED ORDER — SUCCINYLCHOLINE CHLORIDE 20 MG/ML IJ SOLN
75.0000 mg | Freq: Once | INTRAMUSCULAR | Status: AC
  Administered 2015-01-22: 75 mg via INTRAVENOUS

## 2015-01-22 MED ORDER — CHLORHEXIDINE GLUCONATE 0.12% ORAL RINSE (MEDLINE KIT)
15.0000 mL | Freq: Two times a day (BID) | OROMUCOSAL | Status: DC
  Administered 2015-01-22 – 2015-01-23 (×2): 15 mL via OROMUCOSAL

## 2015-01-22 NOTE — ED Notes (Signed)
Pt still having arm twitches, epd gave order for versed

## 2015-01-22 NOTE — ED Notes (Signed)
Bed: RESA Expected date:  Expected time:  Means of arrival:  Comments: Unresponsive overdose

## 2015-01-22 NOTE — H&P (Signed)
PULMONARY / CRITICAL CARE MEDICINE   Name: [name] MRN: [id number] DOB: 07-02-1975    ADMISSION DATE:  01/22/2015  REFERRING MD:  ER  CHIEF COMPLAINT:  Unresponsive  HISTORY OF PRESENT ILLNESS:   Pt unable to provide hx.    39 yo male brought to ER unresponsive.  Reported to have suicidal ideation.  Roommate told EMS he took 10 to 15 flexeril from unlabeled bottle.  Pt also reported have posted message on facebook about his planned suicide.  Pt had episode of vomiting this morning and had snoring respirations.  He was also noted to have jerking body movements.  Reported to also have taken methocarbamol.  He was intubated and lined in ED.  After intubation he became agitated, combative.  He was then started on diprivan and paralyzed.  PAST MEDICAL HISTORY :  He  has a past medical history of Osteoporosis; Renal disorder; Diabetes mellitus; Hypertension; GERD (gastroesophageal reflux disease); Depression; and Renal failure.  PAST SURGICAL HISTORY: He  has past surgical history that includes Nephrectomy transplanted organ and Combined kidney-pancreas transplant.  Allergies  Allergen Reactions  . Penicillins Nausea And Vomiting    No current facility-administered medications on file prior to encounter.   Current Outpatient Prescriptions on File Prior to Encounter  Medication Sig  . predniSONE (STERAPRED UNI-PAK) 5 MG TABS Take 7.5 mg by mouth daily.  Marland Kitchen albuterol (PROVENTIL HFA;VENTOLIN HFA) 108 (90 BASE) MCG/ACT inhaler Inhale 1-2 puffs into the lungs every 6 (six) hours as needed for wheezing or shortness of breath.  Marland Kitchen CALCIUM-VITAMIN D PO Take 1 tablet by mouth daily.  . fish oil-omega-3 fatty acids 1000 MG capsule Take 1 g by mouth daily.  Marland Kitchen HYDROcodone-acetaminophen (NORCO) 5-325 MG per tablet Take 1-2 tablets by mouth every 6 (six) hours as needed for severe pain.  Marland Kitchen MILK THISTLE PO Take 1 tablet by mouth daily.  . Multiple Vitamin (MULTIVITAMIN WITH MINERALS) TABS Take 1  tablet by mouth daily.  Marland Kitchen sulfamethoxazole-trimethoprim (BACTRIM DS) 800-160 MG per tablet Take 1 tablet by mouth 2 (two) times a week. On Mon and Thurs    FAMILY HISTORY:  Unable to obtain due to mental status  SOCIAL HISTORY: He  reports that he has quit smoking. He does not have any smokeless tobacco history on file. He reports that he does not drink alcohol or use illicit drugs.  REVIEW OF SYSTEMS:   Unable to obtain due to mental status  SUBJECTIVE:  Sedated  VITAL SIGNS: BP 143/73 mmHg  Pulse 81  Temp(Src) 98.3 F (36.8 C) (Rectal)  Resp 13  Ht  (1.651 m)  Wt 105 lb (47.628 kg)  BMI 17.47 kg/m2  SpO2 99%  HEMODYNAMICS:    VENTILATOR SETTINGS:    INTAKE / OUTPUT:    PHYSICAL EXAMINATION: General:  thin Neuro:  RASS -3 HEENT: pupils reactive, ETT in place Cardiovascular:  Regular, no murmur Lungs:  No wheeze/rales Abdomen:  Soft, non tender Musculoskeletal:  No edema, decreased muscle bulk Skin:  Multiple tattoos  LABS:  BMET  Recent Labs Lab 01/22/15 1500  NA 137  K 4.7  CL 103  CO2 22  BUN 18  CREATININE 1.92*  GLUCOSE 98    Electrolytes  Recent Labs Lab 01/22/15 1500  CALCIUM 10.0    CBC  Recent Labs Lab 01/22/15 1500  WBC 11.2*  HGB 15.5  HCT 46.6  PLT 255    Coag's No results for input(s): APTT, INR in the last 168 hours.  Sepsis Markers No results for input(s): LATICACIDVEN, PROCALCITON, O2SATVEN in the last 168 hours.  ABG No results for input(s): PHART, PCO2ART, PO2ART in the last 168 hours.  Liver Enzymes  Recent Labs Lab 01/22/15 1500  AST 27  ALT 20  ALKPHOS 159*  BILITOT 0.7  ALBUMIN 4.7    Cardiac Enzymes No results for input(s): TROPONINI, PROBNP in the last 168 hours.  Glucose  Recent Labs Lab 01/22/15 1433 01/22/15 1543  GLUCAP 93 88    Imaging Ct Head Wo Contrast  01/22/2015  CLINICAL DATA:  Unwitnessed fall. Drug overdose with altered mental status EXAM: CT HEAD WITHOUT  CONTRAST CT CERVICAL SPINE WITHOUT CONTRAST TECHNIQUE: Multidetector CT imaging of the head and cervical spine was performed following the standard protocol without intravenous contrast. Multiplanar CT image reconstructions of the cervical spine were also generated. COMPARISON:  CT head and CT cervical spine November 19, 2011 FINDINGS: CT HEAD FINDINGS The ventricles are normal in size and configuration. There is no intracranial mass, hemorrhage, extra-axial fluid collection, or midline shift. Gray-white compartments are normal. No acute infarct evident. The bony calvarium appears intact. The mastoid air cells are clear. Calcification is noted in the carotid siphon regions bilaterally. Calcification is also noted in the distal vertebral arteries, more on the right than on the left. No intraorbital lesions are identified. CT CERVICAL SPINE FINDINGS There is no fracture or spondylolisthesis. Prevertebral soft tissues and predental space regions are normal. Disc spaces appear normal. There are anterior osteophytes at C5 and C6. There is no nerve root edema or effacement. No disc extrusion or stenosis. There are foci of carotid artery calcification bilaterally. IMPRESSION: CT head: Stable areas of atherosclerotic calcification. No intracranial mass, hemorrhage, or focal gray - white compartment lesions/acute appearing infarct. CT cervical spine: No fracture or spondylolisthesis. No appreciable disc space narrowing. No nerve root edema or effacement. Areas of carotid artery calcification noted bilaterally. Electronically Signed   By: Bretta BangWilliam  Woodruff III M.D.   On: 01/22/2015 16:43   Ct Cervical Spine Wo Contrast  01/22/2015  CLINICAL DATA:  Unwitnessed fall. Drug overdose with altered mental status EXAM: CT HEAD WITHOUT CONTRAST CT CERVICAL SPINE WITHOUT CONTRAST TECHNIQUE: Multidetector CT imaging of the head and cervical spine was performed following the standard protocol without intravenous contrast. Multiplanar  CT image reconstructions of the cervical spine were also generated. COMPARISON:  CT head and CT cervical spine November 19, 2011 FINDINGS: CT HEAD FINDINGS The ventricles are normal in size and configuration. There is no intracranial mass, hemorrhage, extra-axial fluid collection, or midline shift. Gray-white compartments are normal. No acute infarct evident. The bony calvarium appears intact. The mastoid air cells are clear. Calcification is noted in the carotid siphon regions bilaterally. Calcification is also noted in the distal vertebral arteries, more on the right than on the left. No intraorbital lesions are identified. CT CERVICAL SPINE FINDINGS There is no fracture or spondylolisthesis. Prevertebral soft tissues and predental space regions are normal. Disc spaces appear normal. There are anterior osteophytes at C5 and C6. There is no nerve root edema or effacement. No disc extrusion or stenosis. There are foci of carotid artery calcification bilaterally. IMPRESSION: CT head: Stable areas of atherosclerotic calcification. No intracranial mass, hemorrhage, or focal gray - white compartment lesions/acute appearing infarct. CT cervical spine: No fracture or spondylolisthesis. No appreciable disc space narrowing. No nerve root edema or effacement. Areas of carotid artery calcification noted bilaterally. Electronically Signed   By: Bretta BangWilliam  Woodruff III M.D.  On: 01/22/2015 16:43   Dg Chest Port 1 View  01/22/2015  CLINICAL DATA:  Altered mental status EXAM: PORTABLE CHEST 1 VIEW COMPARISON:  05/01/2014 FINDINGS: Cardiomediastinal silhouette is stable. No acute infiltrate or pulmonary edema. Bony thorax is stable. Again noted bilateral nipple metallic rings. IMPRESSION: No active disease. Electronically Signed   By: Natasha Mead M.D.   On: 01/22/2015 16:26     STUDIES:  12/15 CT head/neck >> no acute findings  CULTURES:  ANTIBIOTICS:  SIGNIFICANT EVENTS: 12/15 Admit  LINES/TUBES: 12/15 ETT  >> 12/15 CVL (ER) >>  DISCUSSION: 39 yo with suicide attempt from intentional overdose complicated by VDRF and possible seizure.  He has hx of CKD stage V s/p renal transplant in 2004, and pancreas transplant in 2005, DM, HTN, GERD.  ASSESSMENT / PLAN:  PULMONARY A: Acute respiratory failure. P:   Full vent support F/u CXR, ABG PRN BDs  CARDIOVASCULAR A:  Hx of HTN. P:  PRN hydralazine, labetalol for SBP > 170 Hold outpt labetalol for now  RENAL A:   Stage V CKD s/p renal transplant in 2004 >> followed at [facility]. P:   Hold prednisone, prograf, mycophenolate for now Might need renal assessment Monitor renal fx, urine outpt, electrolytes  GASTROINTESTINAL A:   Hx of GERD. S/p pancreas transplant 2005. P:   Protonix Tube feeds if unable to extubate soon  HEMATOLOGIC A:   No acute issues. P:  F/u CBC SQ heparin for DVT prophylaxis  INFECTIOUS A:   PCP prophylaxis. P:   Hold outpt bactrim for now  ENDOCRINE A:   DM type II with renal complications.   P:   SSI  NEUROLOGIC A:   Acute encephalopathy 2nd to intentional drug overdose. P:   RASS goal: 0 Will need psychiatry assessment when more stable  No family available.  CC time 38 minutes.  Coralyn Helling, MD [facility] Pulmonary/Critical Care 01/22/2015, 5:54 PM Pager:  [phone] After 3pm call: [phone]

## 2015-01-22 NOTE — ED Notes (Signed)
critcal care md changed vent rate

## 2015-01-22 NOTE — ED Notes (Signed)
Report given to Denise, RN.

## 2015-01-22 NOTE — ED Notes (Addendum)
Roommate got in touch with pts mother, pt was given methocarbamol 500mg  tablets. Unsure of amount   Will call poison control.

## 2015-01-22 NOTE — ED Notes (Signed)
RT at bedside.

## 2015-01-22 NOTE — ED Notes (Signed)
md at bedside, md checked gag reflex, minimal gag reflex. Pt still having extended periods of apnea.

## 2015-01-22 NOTE — ED Notes (Signed)
ICU notified that room was being cleaned and it was going to be a delay due to staffing,

## 2015-01-22 NOTE — ED Notes (Signed)
Roommate went home and searched house, checked trashcans, pts belongings and found no other signs of drugs, only found pts daily organ transplant medications.

## 2015-01-22 NOTE — ED Notes (Addendum)
Roommate is going home, will call in 1 hour to ICU to see if it is okay to visit.   Roommate taking all belongings home with her.

## 2015-01-22 NOTE — Progress Notes (Signed)
Moved to 16 per CCM

## 2015-01-22 NOTE — ED Notes (Addendum)
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 Per ems pt is from home, has been threatening suicide for several days. Today roommate reported he took "10-15 flexeril" from an unlabeled bottle.   20 G R hand,   initially thought roommate was wife, any documentation with wife is actually the roommate. Pt is separated and there has been no contact with wife/ex wife.

## 2015-01-22 NOTE — ED Notes (Signed)
md at bedside  Pt still having periods of apnea

## 2015-01-22 NOTE — ED Notes (Signed)
Pt back from CT

## 2015-01-22 NOTE — ED Notes (Signed)
Per poison control methocarbamol has a half life of 1-2 hours, and peak of 1-2 hours, there is no antidote, but this medication should have been clear from his system.   Supportive care, intubation is pt keeps losing airway and md thinks it is necessary.

## 2015-01-22 NOTE — ED Notes (Signed)
Pt bucking vent, 4 staff to restrain pt while rn giving more sedation

## 2015-01-22 NOTE — ED Provider Notes (Signed)
CSN: [id number]     Arrival date & time 01/22/15  1429 History   First MD Initiated Contact with Patient 01/22/15 1454     Chief Complaint  Patient presents with  . Drug Overdose     (Consider location/radiation/quality/duration/timing/severity/associated sxs/prior Treatment) HPI Comments: 39 year old male, he has a history of a kidney and pancreas transplant, he is a diabetic, he is a former smoker, he is immunosuppressed on Prograf and prednisone and CellCept. Reportedly according to the paramedics the wife reported that he had overdosed on approximately 10-15 pills this morning, it is unclear what that medication was but thought to be Flexeril. The pill bottle over the pills are accompanying the patient. Paramedics were unable to obtain vascular access, no medications were given prehospital, the patient is intermittently obtunded and unable to give any valuable information, level V caveat applies  Patient is a 39 y.o. male presenting with Overdose. The history is provided by the patient.  Drug Overdose    Past Medical History  Diagnosis Date  . Osteoporosis   . Renal disorder   . Diabetes mellitus   . Hypertension   . GERD (gastroesophageal reflux disease)   . Depression   . Renal failure    Past Surgical History  Procedure Laterality Date  . Nephrectomy transplanted organ    . Combined kidney-pancreas transplant     No family history on file. Social History  Substance Use Topics  . Smoking status: Former Smoker -- 0.50 packs/day  . Smokeless tobacco: Not on file  . Alcohol Use: No    Review of Systems  Unable to perform ROS: Patient unresponsive      Allergies  Penicillins  Home Medications   Prior to Admission medications   Medication Sig Start Date End Date Taking? Authorizing Provider  labetalol (NORMODYNE) 300 MG tablet Take 300 mg by mouth 2 (two) times daily.   Yes Historical Provider, MD  mycophenolate (MYFORTIC) 180 MG EC tablet TAKE 4 TABLETS BY  MOUTH TWICE A DAY 01/15/15  Yes Historical Provider, MD  omeprazole (PRILOSEC) 40 MG capsule Take 40 mg by mouth 2 (two) times daily.   Yes Historical Provider, MD  predniSONE (STERAPRED UNI-PAK) 5 MG TABS Take 7.5 mg by mouth daily.   Yes Historical Provider, MD  tacrolimus (PROGRAF) 1 MG capsule Take 4 capsules (4 mg total) by mouth 2 times daily. 01/15/15  Yes Historical Provider, MD  albuterol (PROVENTIL HFA;VENTOLIN HFA) 108 (90 BASE) MCG/ACT inhaler Inhale 1-2 puffs into the lungs every 6 (six) hours as needed for wheezing or shortness of breath. 04/01/14   Tilden Fossa, MD  CALCIUM-VITAMIN D PO Take 1 tablet by mouth daily.    Historical Provider, MD  fish oil-omega-3 fatty acids 1000 MG capsule Take 1 g by mouth daily.    Historical Provider, MD  HYDROcodone-acetaminophen (NORCO) 5-325 MG per tablet Take 1-2 tablets by mouth every 6 (six) hours as needed for severe pain. 10/21/13   Mercedes Camprubi-Soms, PA-C  magnesium oxide (MAG-OX) 400 MG tablet Take 400 mg by mouth.    Historical Provider, MD  MILK THISTLE PO Take 1 tablet by mouth daily.    Historical Provider, MD  Multiple Vitamin (MULTIVITAMIN WITH MINERALS) TABS Take 1 tablet by mouth daily.    Historical Provider, MD  sulfamethoxazole-trimethoprim (BACTRIM DS) 800-160 MG per tablet Take 1 tablet by mouth 2 (two) times a week. On Mon and Thurs    Historical Provider, MD   BP 143/73 mmHg  Pulse 81  Temp(Src) 98.3 F (36.8 C) (Rectal)  Resp 13  Ht 5\' 7"  (1.702 m)  Wt 105 lb (47.628 kg)  BMI 16.44 kg/m2  SpO2 99% Physical Exam  Constitutional: He appears well-developed and well-nourished. He appears distressed.  HENT:  Head: Normocephalic and atraumatic.  Mouth/Throat: Oropharynx is clear and moist. No oropharyngeal exudate.  Eyes: Conjunctivae and EOM are normal. Pupils are equal, round, and reactive to light. Right eye exhibits no discharge. Left eye exhibits no discharge. No scleral icterus.  Neck: Normal range of motion.  Neck supple. No JVD present. No thyromegaly present.  Cardiovascular: Normal rate, regular rhythm, normal heart sounds and intact distal pulses.  Exam reveals no gallop and no friction rub.   No murmur heard. Pulmonary/Chest:  The patient is breathing between 8 and 12 breaths per minute, he has long Periods and then breathe spontaneously again, oxygen between 97 and 100%, no rales wheezing or increased work of breathing  Abdominal: Soft. Bowel sounds are normal. He exhibits no distension and no mass. There is no tenderness.  Musculoskeletal: Normal range of motion. He exhibits no edema or tenderness.  Lymphadenopathy:    He has no cervical adenopathy.  Neurological: He is alert. Coordination normal.  Somnolent to obtunded, arouses to painful stimuli, speech is clear, patient is disoriented, he denies overdosing on medications, continues to state he is a diabetic and he is tired, that is the extent of his answers, he does not know the name of the president, he does not unable his wife  Skin: Skin is warm and dry. No rash noted. No erythema.  Psychiatric: He has a normal mood and affect. His behavior is normal.  Nursing note and vitals reviewed.   ED Course  .Central Line Date/Time: 01/22/2015 6:00 PM Performed by: Eber Hong Authorized by: Eber Hong Consent: The procedure was performed in an emergent situation. Test results: test results available and properly labeled Site marked: the operative site was marked Imaging studies: imaging studies available Required items: required blood products, implants, devices, and special equipment available Patient identity confirmed: arm band Time out: Immediately prior to procedure a "time out" was called to verify the correct patient, procedure, equipment, support staff and site/side marked as required. Indications: vascular access Patient sedated: yes Sedatives: propofol Preparation: skin prepped with ChloraPrep Skin prep agent dried: skin  prep agent completely dried prior to procedure Sterile barriers: all five maximum sterile barriers used - cap, mask, sterile gown, sterile gloves, and large sterile sheet Hand hygiene: hand hygiene performed prior to central venous catheter insertion Location details: right internal jugular Patient position: Trendelenburg Catheter type: triple lumen Pre-procedure: landmarks identified Ultrasound guidance: yes Sterile ultrasound techniques: sterile gel and sterile probe covers were used Number of attempts: 1 Successful placement: yes Post-procedure: line sutured Assessment: blood return through all ports,  free fluid flow,  placement verified by x-ray and no pneumothorax on x-ray Patient tolerance: Patient tolerated the procedure well with no immediate complications   (including critical care time) Labs Review Labs Reviewed  CBC WITH DIFFERENTIAL/PLATELET - Abnormal; Notable for the following:    WBC 11.2 (*)    Neutro Abs 9.2 (*)    All other components within normal limits  COMPREHENSIVE METABOLIC PANEL - Abnormal; Notable for the following:    Creatinine, Ser 1.92 (*)    Alkaline Phosphatase 159 (*)    GFR calc non Af Amer 42 (*)    GFR calc Af Amer 49 (*)    All other components within normal  limits  URINALYSIS, ROUTINE W REFLEX MICROSCOPIC (NOT AT [facility]) - Abnormal; Notable for the following:    Protein, ur 30 (*)    All other components within normal limits  URINE RAPID DRUG SCREEN, HOSP PERFORMED - Abnormal; Notable for the following:    Tetrahydrocannabinol POSITIVE (*)    All other components within normal limits  ACETAMINOPHEN LEVEL - Abnormal; Notable for the following:    Acetaminophen (Tylenol), Serum <10 (*)    All other components within normal limits  URINE MICROSCOPIC-ADD ON - Abnormal; Notable for the following:    Squamous Epithelial / LPF 0-5 (*)    Bacteria, UA RARE (*)    All other components within normal limits  ETHANOL  SALICYLATE LEVEL  CK  CBG  MONITORING, ED  I-STAT TROPOININ, ED  CBG MONITORING, ED    Imaging Review Ct Head Wo Contrast  01/22/2015  CLINICAL DATA:  Unwitnessed fall. Drug overdose with altered mental status EXAM: CT HEAD WITHOUT CONTRAST CT CERVICAL SPINE WITHOUT CONTRAST TECHNIQUE: Multidetector CT imaging of the head and cervical spine was performed following the standard protocol without intravenous contrast. Multiplanar CT image reconstructions of the cervical spine were also generated. COMPARISON:  CT head and CT cervical spine November 19, 2011 FINDINGS: CT HEAD FINDINGS The ventricles are normal in size and configuration. There is no intracranial mass, hemorrhage, extra-axial fluid collection, or midline shift. Gray-white compartments are normal. No acute infarct evident. The bony calvarium appears intact. The mastoid air cells are clear. Calcification is noted in the carotid siphon regions bilaterally. Calcification is also noted in the distal vertebral arteries, more on the right than on the left. No intraorbital lesions are identified. CT CERVICAL SPINE FINDINGS There is no fracture or spondylolisthesis. Prevertebral soft tissues and predental space regions are normal. Disc spaces appear normal. There are anterior osteophytes at C5 and C6. There is no nerve root edema or effacement. No disc extrusion or stenosis. There are foci of carotid artery calcification bilaterally. IMPRESSION: CT head: Stable areas of atherosclerotic calcification. No intracranial mass, hemorrhage, or focal gray - white compartment lesions/acute appearing infarct. CT cervical spine: No fracture or spondylolisthesis. No appreciable disc space narrowing. No nerve root edema or effacement. Areas of carotid artery calcification noted bilaterally. Electronically Signed   By: Bretta Bang III M.D.   On: 01/22/2015 16:43   Ct Cervical Spine Wo Contrast  01/22/2015  CLINICAL DATA:  Unwitnessed fall. Drug overdose with altered mental status EXAM: CT  HEAD WITHOUT CONTRAST CT CERVICAL SPINE WITHOUT CONTRAST TECHNIQUE: Multidetector CT imaging of the head and cervical spine was performed following the standard protocol without intravenous contrast. Multiplanar CT image reconstructions of the cervical spine were also generated. COMPARISON:  CT head and CT cervical spine November 19, 2011 FINDINGS: CT HEAD FINDINGS The ventricles are normal in size and configuration. There is no intracranial mass, hemorrhage, extra-axial fluid collection, or midline shift. Gray-white compartments are normal. No acute infarct evident. The bony calvarium appears intact. The mastoid air cells are clear. Calcification is noted in the carotid siphon regions bilaterally. Calcification is also noted in the distal vertebral arteries, more on the right than on the left. No intraorbital lesions are identified. CT CERVICAL SPINE FINDINGS There is no fracture or spondylolisthesis. Prevertebral soft tissues and predental space regions are normal. Disc spaces appear normal. There are anterior osteophytes at C5 and C6. There is no nerve root edema or effacement. No disc extrusion or stenosis. There are foci of carotid artery calcification  bilaterally. IMPRESSION: CT head: Stable areas of atherosclerotic calcification. No intracranial mass, hemorrhage, or focal gray - white compartment lesions/acute appearing infarct. CT cervical spine: No fracture or spondylolisthesis. No appreciable disc space narrowing. No nerve root edema or effacement. Areas of carotid artery calcification noted bilaterally. Electronically Signed   By: Bretta Bang III M.D.   On: 01/22/2015 16:43   Dg Chest Port 1 View  01/22/2015  CLINICAL DATA:  Altered mental status EXAM: PORTABLE CHEST 1 VIEW COMPARISON:  05/01/2014 FINDINGS: Cardiomediastinal silhouette is stable. No acute infiltrate or pulmonary edema. Bony thorax is stable. Again noted bilateral nipple metallic rings. IMPRESSION: No active disease.  Electronically Signed   By: Natasha Mead M.D.   On: 01/22/2015 16:26   I have personally reviewed and evaluated these images and lab results as part of my medical decision-making.   EKG Interpretation   Date/Time:  Thursday January 22 2015 14:32:11 EST Ventricular Rate:  78 PR Interval:  130 QRS Duration: 130 QT Interval:  457 QTC Calculation: 521 R Axis:   88 Text Interpretation:  Sinus rhythm Right bundle branch block Abnormal  lateral Q waves QT prolonged since last tracing no significant change  Confirmed by Ryett Hamman  MD, Roth Ress ([id number]) on 01/22/2015 3:11:34 PM      MDM   Final diagnoses:  Acute respiratory failure, unspecified whether with hypoxia or hypercapnia (HCC)  Overdose, intentional self-harm, initial encounter (HCC)    The etiology of the abnormal mental status is unclear, suspect overdose, will perform EKG, labs, cardiac monitoring as well as capnography to evaluate for respiratory function. He did not respond to Narcan, his pupils are smallish had approximately 2-3 mm, his EKG shows right bundle branch block, widened QRS, normal QT however the QTC is prolonged at 520. We'll contact poison control.  ECG unchanged from 2/16  Information has just returned about the patient, evidently at about midnight last night he left a "goodbye world" face book post, then took 10-15 tablets of a muscle relaxer that was given to his wife from her mother, we are unclear about what that medication was. Evidently he became sleepy, he fell over the course of the night and refused to let anybody get him up off the ground. We'll pursue CT scan imaging, creatine kinase, CT scan of the neck in the brain. He has had no increase in his mental status, he continues to have periods of apnea lasting 15-20 seconds however he maintains a gag reflex and when you wake him up he is able to follow simple commands.  He is on capnography, frequent neuro checks.  The patient decompensated, he required admission  to the hospital to the intensive care unit, he was intubated by myself at 5:15 PM secondary to respiratory failure, no gag reflex, not protecting his airway, frequent episodes of apnea lasting 20-30 seconds.  Ultrasound used to place central line, images archived  INTUBATION Performed by: Vida Roller  Required items: required blood products, implants, devices, and special equipment available Patient identity confirmed: provided demographic data and hospital-assigned identification number Time out: Immediately prior to procedure a "time out" was called to verify the correct patient, procedure, equipment, support staff and site/side marked as required.  Indications: apnea  Intubation method: Direct Laryngoscopy   Preoxygenation: BVM  Sedatives: 10mg  Etomidate Paralytic: 100mg  Succinylcholine  Tube Size: 8 cuffed  Post-procedure assessment: chest rise and ETCO2 monitor Breath sounds: equal and absent over the epigastrium Tube secured with: ETT holder Chest x-ray interpreted by radiologist and  me.  Chest x-ray findings: endotracheal tube in appropriate position  Patient tolerated the procedure well with no immediate complications.   CRITICAL CARE Performed by: Vida RollerMILLER,Flemon Kelty D Total critical care time: 35 minutes Critical care time was exclusive of separately billable procedures and treating other patients. Critical care was necessary to treat or prevent imminent or life-threatening deterioration. Critical care was time spent personally by me on the following activities: development of treatment plan with patient and/or surrogate as well as nursing, discussions with consultants, evaluation of patient's response to treatment, examination of patient, obtaining history from patient or surrogate, ordering and performing treatments and interventions, ordering and review of laboratory studies, ordering and review of radiographic studies, pulse oximetry and re-evaluation of patient's  condition.   Poison control now states that the Robaxin which was thought to be the medication that he overdosed on has a very short half-life, that he should be back to normal, I suspect there was other medications involved, he did not respond to Narcan, the patient is hemodynamically stable now that his airway is secured and he is oxygenating appropriately.  D/w Dr. Sherene SiresWert - will accept to ICU.  Medications  propofol (DIPRIVAN) 1000 MG/100ML infusion (not administered)  sodium chloride 0.9 % bolus 1,000 mL (0 mLs Intravenous Stopped 01/22/15 1552)  naloxone (NARCAN) injection 1 mg (1 mg Intravenous Given 01/22/15 1445)  0.9 %  sodium chloride infusion ( Intravenous New Bag/Given 01/22/15 1642)     Eber HongBrian Shalynn Jorstad, MD 01/22/15 [phone]

## 2015-01-22 NOTE — ED Notes (Signed)
All pts belongings given to roommate.  Roommate Patrice ParadiseMelissa Sheppard [phone]) if unable to get in touch with roommate, call Bernette MayersSheldon [phone]

## 2015-01-22 NOTE — ED Notes (Addendum)
md miller at bedside. Pt have spells of apnea, must be physically stimulated to breath. Narcan had no affect on pt. Capnography applied  Pt occasionally will open eyes, pt spoke with md and rn, pt denies taking any kind of medications.

## 2015-01-22 NOTE — Progress Notes (Signed)
eLink Physician-Brief Progress Note Patient Name: [name] DOB: 07-12-1975 MRN: [id number]   Date of Service  01/22/2015  HPI/Events of Note  resp alkalosis on present vent setting not over breathing vent per rt  eICU Interventions  Reduce Rate to 16 and recheck abg's one hour     Intervention Category Major Interventions: Respiratory failure - evaluation and management  Sandrea HughsMichael Shadoe Bethel 01/22/2015, 6:51 PM

## 2015-01-22 NOTE — ED Notes (Addendum)
Nurse spoke with roommate, roommate reports yesterday she went to urgent care for a migraine, received migraine cocktail which knocked her out for the night when they got back home. Roommate woke up with lots of text messages asking "what was going on" with the patient. Roommate reports pt posted on fb a "goodbye world" message on 12/15 at 1230 midnight. A friend named Fleet ContrasRachel came over to house to check on pt around 0230. Pt reported that he took a muscle relaxer. Roommate since this morning has been checking on pt and he was sleepy. Today at 1130 pt vomited, roommate sat with him an hour, eventually "he rolled over asleep and was snoring". Pt tried to get up around 1330/1400 and vomited again on bed. Pt was stumbling/ was on the floor and would not let roommate help him up. Then pt started having "jerking movements". Roommate unsure if they were seizures, no hx of seizure. Pt then vomited/ spit up blood upon ems arrival.   Roommate reported that pt was given some muscle relaxers from his mother 3 weeks ago, showed her the bottle with pills, estimated there were 10-15 pills, but pills were in an unlabeled bottle. rn asking roommate to contact pts mother and ask what the medication was specifically. Roommate said that today the pill bottle with muscle relaxers in it was empty and that earlier today pt told Fleet ContrasRachel friend that he took muscle relaxants.  At 1646 roommate reported that mother stated she gave pt methocarbamol 500 tablets.

## 2015-01-22 NOTE — Progress Notes (Signed)
Tube moved to 25 when it was placed at 23. Xray confirmed the placement at 25 but was 4.2 cm above the carina.

## 2015-01-23 ENCOUNTER — Inpatient Hospital Stay (HOSPITAL_COMMUNITY): Payer: Medicare Other

## 2015-01-23 DIAGNOSIS — J9600 Acute respiratory failure, unspecified whether with hypoxia or hypercapnia: Secondary | ICD-10-CM

## 2015-01-23 DIAGNOSIS — T50902D Poisoning by unspecified drugs, medicaments and biological substances, intentional self-harm, subsequent encounter: Secondary | ICD-10-CM

## 2015-01-23 DIAGNOSIS — E440 Moderate protein-calorie malnutrition: Secondary | ICD-10-CM | POA: Diagnosis present

## 2015-01-23 LAB — BLOOD GAS, ARTERIAL
Acid-base deficit: 2.9 mmol/L — ABNORMAL HIGH (ref 0.0–2.0)
BICARBONATE: 19.1 meq/L — AB (ref 20.0–24.0)
Drawn by: 41308
FIO2: 0.4
LHR: 16 {breaths}/min
O2 Saturation: 99.3 %
PEEP: 5 cmH2O
Patient temperature: 37
TCO2: 16.9 mmol/L (ref 0–100)
VT: 500 mL
pCO2 arterial: 26.8 mmHg — ABNORMAL LOW (ref 35.0–45.0)
pH, Arterial: 7.467 — ABNORMAL HIGH (ref 7.350–7.450)
pO2, Arterial: 200 mmHg — ABNORMAL HIGH (ref 80.0–100.0)

## 2015-01-23 LAB — GLUCOSE, CAPILLARY
GLUCOSE-CAPILLARY: 72 mg/dL (ref 65–99)
GLUCOSE-CAPILLARY: 79 mg/dL (ref 65–99)
GLUCOSE-CAPILLARY: 74 mg/dL (ref 65–99)
GLUCOSE-CAPILLARY: 94 mg/dL (ref 65–99)
Glucose-Capillary: 70 mg/dL (ref 65–99)

## 2015-01-23 LAB — COMPREHENSIVE METABOLIC PANEL
ALBUMIN: 3.6 g/dL (ref 3.5–5.0)
ALT: 15 U/L — ABNORMAL LOW (ref 17–63)
ANION GAP: 8 (ref 5–15)
AST: 25 U/L (ref 15–41)
Alkaline Phosphatase: 127 U/L — ABNORMAL HIGH (ref 38–126)
BUN: 19 mg/dL (ref 6–20)
CO2: 22 mmol/L (ref 22–32)
Calcium: 9.2 mg/dL (ref 8.9–10.3)
Chloride: 108 mmol/L (ref 101–111)
Creatinine, Ser: 1.92 mg/dL — ABNORMAL HIGH (ref 0.61–1.24)
GFR calc Af Amer: 49 mL/min — ABNORMAL LOW (ref >60)
GFR calc non Af Amer: 42 mL/min — ABNORMAL LOW (ref >60)
GLUCOSE: 75 mg/dL (ref 65–99)
POTASSIUM: 3.8 mmol/L (ref 3.5–5.1)
SODIUM: 138 mmol/L (ref 135–145)
TOTAL PROTEIN: 6.3 g/dL — AB (ref 6.5–8.1)
Total Bilirubin: 0.8 mg/dL (ref 0.3–1.2)

## 2015-01-23 LAB — CBC
HEMATOCRIT: 38.8 % — AB (ref 39.0–52.0)
HEMOGLOBIN: 12.3 g/dL — AB (ref 13.0–17.0)
MCH: 26 pg (ref 26.0–34.0)
MCHC: 31.7 g/dL (ref 30.0–36.0)
MCV: 82 fL (ref 78.0–100.0)
Platelets: 242 10*3/uL (ref 150–400)
RBC: 4.73 MIL/uL (ref 4.22–5.81)
RDW: 14.8 % (ref 11.5–15.5)
WBC: 9.9 10*3/uL (ref 4.0–10.5)

## 2015-01-23 LAB — PHOSPHORUS: PHOSPHORUS: 2.4 mg/dL — AB (ref 2.5–4.6)

## 2015-01-23 LAB — MAGNESIUM: Magnesium: 1.8 mg/dL (ref 1.7–2.4)

## 2015-01-23 MED ORDER — OMEGA-3-ACID ETHYL ESTERS 1 G PO CAPS
1.0000 g | ORAL_CAPSULE | Freq: Every day | ORAL | Status: DC
  Administered 2015-01-23 – 2015-01-26 (×4): 1 g via ORAL
  Filled 2015-01-23 (×4): qty 1

## 2015-01-23 MED ORDER — PREDNISONE 5 MG PO TABS
5.0000 mg | ORAL_TABLET | Freq: Every day | ORAL | Status: DC
  Administered 2015-01-23 – 2015-01-26 (×4): 5 mg via ORAL
  Filled 2015-01-23 (×4): qty 1

## 2015-01-23 MED ORDER — TACROLIMUS 1 MG PO CAPS
3.0000 mg | ORAL_CAPSULE | Freq: Two times a day (BID) | ORAL | Status: DC
  Administered 2015-01-23 – 2015-01-26 (×7): 3 mg via ORAL
  Filled 2015-01-23 (×9): qty 3

## 2015-01-23 MED ORDER — SULFAMETHOXAZOLE-TRIMETHOPRIM 800-160 MG PO TABS
1.0000 | ORAL_TABLET | ORAL | Status: DC
  Administered 2015-01-26: 1 via ORAL
  Filled 2015-01-23 (×2): qty 1

## 2015-01-23 MED ORDER — ADULT MULTIVITAMIN W/MINERALS CH
1.0000 | ORAL_TABLET | Freq: Every day | ORAL | Status: DC
  Administered 2015-01-23 – 2015-01-26 (×4): 1 via ORAL
  Filled 2015-01-23 (×4): qty 1

## 2015-01-23 MED ORDER — MYCOPHENOLATE SODIUM 180 MG PO TBEC
720.0000 mg | DELAYED_RELEASE_TABLET | Freq: Two times a day (BID) | ORAL | Status: DC
  Administered 2015-01-23 – 2015-01-26 (×7): 720 mg via ORAL
  Filled 2015-01-23 (×9): qty 4

## 2015-01-23 NOTE — Progress Notes (Signed)
PULMONARY / CRITICAL CARE MEDICINE   Name: [name] MRN: [id number] DOB: 28-Aug-1975    ADMISSION DATE:  01/22/2015  REFERRING MD:  ER  CHIEF COMPLAINT:  Unresponsive  HISTORY OF PRESENT ILLNESS:   Pt unable to provide hx.    39 yo male brought to ER unresponsive.  Reported to have suicidal ideation.  Roommate told EMS he took 10 to 15 flexeril from unlabeled bottle.  Pt also reported have posted message on facebook about his planned suicide.  Pt had episode of vomiting this morning and had snoring respirations.  He was also noted to have jerking body movements.  Reported to also have taken methocarbamol.  He was intubated and lined in ED.  After intubation he became agitated, combative.  He was then started on diprivan and paralyzed.  PAST MEDICAL HISTORY :  He  has a past medical history of Osteoporosis; Renal disorder; Diabetes mellitus; Hypertension; GERD (gastroesophageal reflux disease); Depression; and Renal failure.  PAST SURGICAL HISTORY: He  has past surgical history that includes Nephrectomy transplanted organ and Combined kidney-pancreas transplant.  Allergies  Allergen Reactions  . Penicillins Nausea And Vomiting    No current facility-administered medications on file prior to encounter.   Current Outpatient Prescriptions on File Prior to Encounter  Medication Sig  . albuterol (PROVENTIL HFA;VENTOLIN HFA) 108 (90 BASE) MCG/ACT inhaler Inhale 1-2 puffs into the lungs every 6 (six) hours as needed for wheezing or shortness of breath.  Marland Kitchen CALCIUM-VITAMIN D PO Take 1 tablet by mouth daily.  . fish oil-omega-3 fatty acids 1000 MG capsule Take 1 g by mouth daily.  Marland Kitchen HYDROcodone-acetaminophen (NORCO) 5-325 MG per tablet Take 1-2 tablets by mouth every 6 (six) hours as needed for severe pain.  Marland Kitchen MILK THISTLE PO Take 1 tablet by mouth daily.  . Multiple Vitamin (MULTIVITAMIN WITH MINERALS) TABS Take 1 tablet by mouth daily.  Marland Kitchen sulfamethoxazole-trimethoprim (BACTRIM DS) 800-160  MG per tablet Take 1 tablet by mouth 2 (two) times a week. On Mon and Thurs    FAMILY HISTORY:  Unable to obtain due to mental status  SOCIAL HISTORY: He  reports that he has quit smoking. He does not have any smokeless tobacco history on file. He reports that he does not drink alcohol or use illicit drugs.  REVIEW OF SYSTEMS:   Unable to obtain due to mental status  SUBJECTIVE:  Sedated  VITAL SIGNS: BP 162/80 mmHg  Pulse 74  Temp(Src) 98.4 F (36.9 C) (Axillary)  Resp 15  Ht  (1.651 m)  Wt 114 lb 6.7 oz (51.9 kg)  BMI 19.04 kg/m2  SpO2 100%  HEMODYNAMICS:    VENTILATOR SETTINGS: Vent Mode:  [-] CPAP FiO2 (%):  [30 %-100 %] 30 % Set Rate:  [14 bmp-20 bmp] 16 bmp Vt Set:  [500 mL] 500 mL PEEP:  [5 cmH20] 5 cmH20 Pressure Support:  [5 cmH20-10 cmH20] 5 cmH20 Plateau Pressure:  [15 cmH20-18 cmH20] 17 cmH20  INTAKE / OUTPUT: I/O last 3 completed shifts: In: 3101.5 [I.V.:3101.5] Out: 675 [Urine:550; Other:125]  PHYSICAL EXAMINATION: General:  Sedated, cachectic Neuro:  Opens eyes to commands, no focal deficits, moves all 4 extremities HEENT: PERRLA, ETT in place Cardiovascular:  Stone S2, regular rate and rhythm, no MRG Lungs:  No wheeze or crackles Abdomen:  Soft, non tender Musculoskeletal:  No edema, decreased muscle bulk Skin:  Multiple tattoos  LABS:  BMET  Recent Labs Lab 01/22/15 1500 01/23/15 0530  NA 137 138  K 4.7 3.8  CL 103 108  CO2 22 22  BUN 18 19  CREATININE 1.92* 1.92*  GLUCOSE 98 75    Electrolytes  Recent Labs Lab 01/22/15 1500 01/23/15 0530  CALCIUM 10.0 9.2  MG  --  1.8  PHOS  --  2.4*    CBC  Recent Labs Lab 01/22/15 1500 01/23/15 0530  WBC 11.2* 9.9  HGB 15.5 12.3*  HCT 46.6 38.8*  PLT 255 242    Coag's No results for input(s): APTT, INR in the last 168 hours.  Sepsis Markers No results for input(s): LATICACIDVEN, PROCALCITON, O2SATVEN in the last 168 hours.  ABG  Recent Labs Lab  01/22/15 2000 01/23/15 0320  PHART 7.519* 7.467*  PCO2ART 24.3* 26.8*  PO2ART 246* 200*    Liver Enzymes  Recent Labs Lab 01/22/15 1500 01/23/15 0530  AST 27 25  ALT 20 15*  ALKPHOS 159* 127*  BILITOT 0.7 0.8  ALBUMIN 4.7 3.6    Cardiac Enzymes No results for input(s): TROPONINI, PROBNP in the last 168 hours.  Glucose  Recent Labs Lab 01/22/15 1543 01/22/15 1728 01/22/15 2014 01/22/15 2354 01/23/15 0436 01/23/15 0743  GLUCAP 88 98 97 75 72 79    Imaging Ct Head Wo Contrast  01/22/2015  CLINICAL DATA:  Unwitnessed fall. Drug overdose with altered mental status EXAM: CT HEAD WITHOUT CONTRAST CT CERVICAL SPINE WITHOUT CONTRAST TECHNIQUE: Multidetector CT imaging of the head and cervical spine was performed following the standard protocol without intravenous contrast. Multiplanar CT image reconstructions of the cervical spine were also generated. COMPARISON:  CT head and CT cervical spine November 19, 2011 FINDINGS: CT HEAD FINDINGS The ventricles are normal in size and configuration. There is no intracranial mass, hemorrhage, extra-axial fluid collection, or midline shift. Gray-white compartments are normal. No acute infarct evident. The bony calvarium appears intact. The mastoid air cells are clear. Calcification is noted in the carotid siphon regions bilaterally. Calcification is also noted in the distal vertebral arteries, more on the right than on the left. No intraorbital lesions are identified. CT CERVICAL SPINE FINDINGS There is no fracture or spondylolisthesis. Prevertebral soft tissues and predental space regions are normal. Disc spaces appear normal. There are anterior osteophytes at C5 and C6. There is no nerve root edema or effacement. No disc extrusion or stenosis. There are foci of carotid artery calcification bilaterally. IMPRESSION: CT head: Stable areas of atherosclerotic calcification. No intracranial mass, hemorrhage, or focal gray - white compartment  lesions/acute appearing infarct. CT cervical spine: No fracture or spondylolisthesis. No appreciable disc space narrowing. No nerve root edema or effacement. Areas of carotid artery calcification noted bilaterally. Electronically Signed   By: Bretta Bang III M.D.   On: 01/22/2015 16:43   Ct Cervical Spine Wo Contrast  01/22/2015  CLINICAL DATA:  Unwitnessed fall. Drug overdose with altered mental status EXAM: CT HEAD WITHOUT CONTRAST CT CERVICAL SPINE WITHOUT CONTRAST TECHNIQUE: Multidetector CT imaging of the head and cervical spine was performed following the standard protocol without intravenous contrast. Multiplanar CT image reconstructions of the cervical spine were also generated. COMPARISON:  CT head and CT cervical spine November 19, 2011 FINDINGS: CT HEAD FINDINGS The ventricles are normal in size and configuration. There is no intracranial mass, hemorrhage, extra-axial fluid collection, or midline shift. Gray-white compartments are normal. No acute infarct evident. The bony calvarium appears intact. The mastoid air cells are clear. Calcification is noted in the carotid siphon regions bilaterally. Calcification is also noted in the distal vertebral arteries, more on the  right than on the left. No intraorbital lesions are identified. CT CERVICAL SPINE FINDINGS There is no fracture or spondylolisthesis. Prevertebral soft tissues and predental space regions are normal. Disc spaces appear normal. There are anterior osteophytes at C5 and C6. There is no nerve root edema or effacement. No disc extrusion or stenosis. There are foci of carotid artery calcification bilaterally. IMPRESSION: CT head: Stable areas of atherosclerotic calcification. No intracranial mass, hemorrhage, or focal gray - white compartment lesions/acute appearing infarct. CT cervical spine: No fracture or spondylolisthesis. No appreciable disc space narrowing. No nerve root edema or effacement. Areas of carotid artery calcification  noted bilaterally. Electronically Signed   By: Bretta Bang III M.D.   On: 01/22/2015 16:43   Dg Chest Port 1 View  01/23/2015  CLINICAL DATA:  Respiratory failure. EXAM: PORTABLE CHEST 1 VIEW COMPARISON:  01/22/2015. FINDINGS: Endotracheal tube, NG tube, right IJ line in stable position. Heart size stable. Mild left base subsegmental atelectasis and or infiltrate. No pleural effusion or pneumothorax. IMPRESSION: 1. Lines and tubes in stable position. 2. New onset mild left base subsegmental atelectasis and or infiltrate. Electronically Signed   By: Maisie Fus  Register   On: 01/23/2015 07:14   Dg Chest Port 1 View  01/22/2015  CLINICAL DATA:  Drug overdose. Endotracheal tube and central line placements. History of hypertension, diabetes, former smoker. EXAM: PORTABLE CHEST 1 VIEW COMPARISON:  01/22/2015 FINDINGS: Interval placement of an endotracheal tube with tip measuring 4.2 cm above the carinal. Enteric tube placed with tip in the left upper quadrant consistent with location in the body of the stomach. Right central venous catheter with tip over the low SVC region. No pneumothorax. Shallow inspiration. Heart size and pulmonary vascularity are normal. No focal airspace disease or consolidation in the lungs. No blunting of costophrenic angles. Metallic nipple piercings. IMPRESSION: Appliances appear in satisfactory position. No evidence of active pulmonary disease. Electronically Signed   By: Burman Nieves M.D.   On: 01/22/2015 18:20   Dg Chest Port 1 View  01/22/2015  CLINICAL DATA:  Altered mental status EXAM: PORTABLE CHEST 1 VIEW COMPARISON:  05/01/2014 FINDINGS: Cardiomediastinal silhouette is stable. No acute infiltrate or pulmonary edema. Bony thorax is stable. Again noted bilateral nipple metallic rings. IMPRESSION: No active disease. Electronically Signed   By: Natasha Mead M.D.   On: 01/22/2015 16:26     STUDIES:  12/15 CT head/neck >> no acute  findings  CULTURES:  ANTIBIOTICS:  SIGNIFICANT EVENTS: 12/15 Admit  LINES/TUBES: 12/15 ETT >> 12/15 CVL (ER) >>  DISCUSSION: 39 yo with suicide attempt from intentional overdose complicated by VDRF and possible seizure.  He has hx of CKD stage V s/p renal transplant in 2004, and pancreas transplant in 2005, DM, HTN, GERD. Admitted with intentional drug overdose, suicide attempt. He took approximately 10-15 tablets of methocarbamol.  ASSESSMENT / PLAN:  PULMONARY A: Acute respiratory failure. P:   Full vent support PRN BDs Daily wakeup and SBT  CARDIOVASCULAR A:  Hx of HTN. P:  PRN hydralazine, labetalol for SBP > 170 Hold outpt labetalol for now  RENAL A:   Stage V CKD s/p renal transplant in 2004 >> followed at [facility]. P:   Hold prednisone, prograf, mycophenolate for now Monitor renal fx, urine outpt, electrolytes  GASTROINTESTINAL A:   Hx of GERD. S/p pancreas transplant 2005. P:   Protonix Tube feeds if unable to extubate soon  HEMATOLOGIC A:   No acute issues. P:  F/u CBC SQ heparin for DVT  prophylaxis  INFECTIOUS A:   PCP prophylaxis. P:   Hold outpt bactrim for now  ENDOCRINE A:   DM type II with renal complications.   P:   SSI  NEUROLOGIC A:   Acute encephalopathy 2nd to intentional drug overdose. Acute depression secondary to ongoing divorce proceeding P:   RASS goal: 0 Will need psychiatry assessment when extubated. Bedside sitter when extubated.  FAMILY: I spoke with his mother Lyla Sonnn Small [phone]) who lives in the [place]. She told me that the patient had been depressed because of ongoing divorce from his wife. She said that the final decision would probably come through at any time. She is the decision maker at this point  CC time- 35 mins  Chilton GreathousePraveen Abdulloh Ullom MD [facility] Pager [phone] If no answer or after 3pm call: [phone] 01/23/2015, 9:41 AM

## 2015-01-23 NOTE — Progress Notes (Signed)
Initial Nutrition Assessment  DOCUMENTATION CODES:   Non-severe (moderate) malnutrition in context of social or environmental circumstances  INTERVENTION:  - If pt to remain intubated >/=24 hours, recommend Jevity 1.2 @ 55 mL/hr which will provide 1584 kcal (99% estimated kcal needs), 73 grams of protein, and 1065 mL free water. - RD will continue to monitor for needs  NUTRITION DIAGNOSIS:   Inadequate oral intake related to inability to eat as evidenced by NPO status.  GOAL:   Patient will meet greater than or equal to 90% of their needs  MONITOR:   Vent status, Weight trends, Labs, Skin, I & O's  REASON FOR ASSESSMENT:   Ventilator  ASSESSMENT:   39 yo male brought to ER unresponsive. Reported to have suicidal ideation. Roommate told EMS he took 10 to 15 flexeril from unlabeled bottle. Pt also reported have posted message on facebook about his planned suicide. Pt had episode of vomiting this morning and had snoring respirations. He was also noted to have jerking body movements. Reported to also have taken methocarbamol. He was intubated and lined in ED. After intubation he became agitated, combative. He was then started on diprivan and paralyzed.  Pt seen for new vent. BMI indicates normal weight status.  Patient is currently intubated on ventilator support MV: 9 L/min Temp (24hrs), Avg:98.4 F (36.9 C), Min:97.9 F (36.6 C), Max:99.1 F (37.3 C) Propofol: none ml/hr  No family/visitors present at time of RD visit to provide information from PTA. Chart review indicates weight fluctuations over the past 10 months with most recent indicating 16 lb weight loss (12% body weight) in the past 5 months, this is significant for time frame. Moderate muscle wasting noted to BLE with muscle and fat stores WDL to upper body.   Not able to meet needs at this time. TF recommendations outlined above should pt remain intubated. Medications reviewed. Labs reviewed; creatinine  elevated, Phos: 2.4 mg/dL, GFR: 42.   Diet Order:  Diet NPO time specified  Skin:  Reviewed, no issues  Last BM:  PTA  Height:   Ht Readings from Last 1 Encounters:  01/22/15 5\' 5"  (1.651 m)    Weight:   Wt Readings from Last 1 Encounters:  01/23/15 114 lb 6.7 oz (51.9 kg)    Ideal Body Weight:  56.82 kg (kg)  BMI:  Body mass index is 19.04 kg/(m^2).  Estimated Nutritional Needs:   Kcal:  1605  Protein:  62-78 grams  Fluid:  1.8-2 L/day  EDUCATION NEEDS:   No education needs identified at this time     Trenton GammonJessica Kendrix Orman, RD, LDN Inpatient Clinical Dietitian Pager # [phone] After hours/weekend pager # [phone]

## 2015-01-23 NOTE — Progress Notes (Signed)
PSY consulted called for evaluation.  Tolerating extubation well. Resumed home medications.     Canary BrimBrandi Ollis, NP-C [facility] Pgr: [phone] or if no answer [phone] 01/23/2015, 1:42 PM

## 2015-01-23 NOTE — Procedures (Signed)
Extubation Procedure Note  Patient Details:   Name: [name] DOB: [birth year] MRN: [id number]   Airway Documentation:     Evaluation  O2 sats: stable throughout Complications: No apparent complications Patient did tolerate procedure well. Bilateral Breath Sounds: Clear, Diminished Suctioning: Oral, Airway Yes  Suzan GaribaldiCraddock, Rorey Bisson Ann 01/23/2015, 12:24 PM

## 2015-01-23 NOTE — Progress Notes (Signed)
0500: Call from Patient's mother, Lyla Sonnn Small, who resides in [place].  She gave her number, wants to get information about patient's status. [phone].

## 2015-01-23 NOTE — Care Management Note (Signed)
Case Management Note  Patient Details  Name: [name] MRN: [id number] Date of Birth: 02-16-1975  Subjective/Objective:                 overdose   Action/Plan:Date: January 23, 2015 Chart reviewed for concurrent status and case management needs. Will continue to follow patient for changes and needs: Marcelle Smilinghonda Jakell Trusty, RN, BSN, [place]   [phone]   Expected Discharge Date:   (unkown)               Expected Discharge Plan:  Home/Self Care  In-House Referral:  Clinical Social Work  Discharge planning Services  CM Consult  Post Acute Care Choice:  NA Choice offered to:  NA  DME Arranged:  N/A DME Agency:  NA  HH Arranged:  NA HH Agency:  NA  Status of Service:  In process, will continue to follow  Medicare Important Message Given:    Date Medicare IM Given:    Medicare IM give by:    Date Additional Medicare IM Given:    Additional Medicare Important Message give by:     If discussed at Long Length of Stay Meetings, dates discussed:    Additional Comments:  Golda AcreDavis, Cyrena Kuchenbecker Lynn, RN 01/23/2015, 9:40 AM

## 2015-01-24 ENCOUNTER — Inpatient Hospital Stay (HOSPITAL_COMMUNITY): Payer: Medicare Other

## 2015-01-24 DIAGNOSIS — F321 Major depressive disorder, single episode, moderate: Secondary | ICD-10-CM | POA: Diagnosis present

## 2015-01-24 LAB — BASIC METABOLIC PANEL
Anion gap: 9 (ref 5–15)
BUN: 24 mg/dL — AB (ref 6–20)
CALCIUM: 8.5 mg/dL — AB (ref 8.9–10.3)
CHLORIDE: 109 mmol/L (ref 101–111)
CO2: 20 mmol/L — ABNORMAL LOW (ref 22–32)
CREATININE: 1.86 mg/dL — AB (ref 0.61–1.24)
GFR calc non Af Amer: 44 mL/min — ABNORMAL LOW (ref >60)
GFR, EST AFRICAN AMERICAN: 51 mL/min — AB (ref >60)
Glucose, Bld: 78 mg/dL (ref 65–99)
Potassium: 4.2 mmol/L (ref 3.5–5.1)
SODIUM: 138 mmol/L (ref 135–145)

## 2015-01-24 LAB — CBC
HCT: 36.2 % — ABNORMAL LOW (ref 39.0–52.0)
HEMOGLOBIN: 11.4 g/dL — AB (ref 13.0–17.0)
MCH: 26.1 pg (ref 26.0–34.0)
MCHC: 31.5 g/dL (ref 30.0–36.0)
MCV: 82.8 fL (ref 78.0–100.0)
PLATELETS: 221 10*3/uL (ref 150–400)
RBC: 4.37 MIL/uL (ref 4.22–5.81)
RDW: 14.9 % (ref 11.5–15.5)
WBC: 9.8 10*3/uL (ref 4.0–10.5)

## 2015-01-24 LAB — GLUCOSE, CAPILLARY
GLUCOSE-CAPILLARY: 84 mg/dL (ref 65–99)
GLUCOSE-CAPILLARY: 116 mg/dL — AB (ref 65–99)
Glucose-Capillary: 74 mg/dL (ref 65–99)
Glucose-Capillary: 77 mg/dL (ref 65–99)
Glucose-Capillary: 87 mg/dL (ref 65–99)
Glucose-Capillary: 92 mg/dL (ref 65–99)

## 2015-01-24 LAB — MAGNESIUM: Magnesium: 1.5 mg/dL — ABNORMAL LOW (ref 1.7–2.4)

## 2015-01-24 LAB — PHOSPHORUS: Phosphorus: 3.9 mg/dL (ref 2.5–4.6)

## 2015-01-24 MED ORDER — INSULIN ASPART 100 UNIT/ML ~~LOC~~ SOLN: 0.0000 [IU] | Freq: Three times a day (TID) | SUBCUTANEOUS | Status: DC

## 2015-01-24 MED ORDER — ONDANSETRON HCL 4 MG/2ML IJ SOLN: 4.0000 mg | Freq: Three times a day (TID) | INTRAMUSCULAR | Status: AC | PRN

## 2015-01-24 MED ORDER — LABETALOL HCL 300 MG PO TABS
300.0000 mg | ORAL_TABLET | Freq: Two times a day (BID) | ORAL | Status: DC
  Administered 2015-01-24 – 2015-01-26 (×5): 300 mg via ORAL
  Filled 2015-01-24 (×6): qty 1

## 2015-01-24 NOTE — Consult Note (Signed)
[facility] Face-to-Face Psychiatry Consult   Reason for Consult: depression, suicide attempt by OD Referring Physician:  Dr. Vaughan Browner Patient Identification: [name] MRN:  [id number] Principal Diagnosis: Major depressive disorder, single episode, moderate ([place]) Diagnosis:   Patient Active Problem List   Diagnosis Date Noted  . Major depressive disorder, single episode, moderate ([place]) [F32.1] 01/24/2015    Priority: High  . Malnutrition of moderate degree [E44.0] 01/23/2015  . Respiratory failure ([place]) [J96.90] 01/22/2015  . Intentional drug overdose ([place]) [T50.902A] 01/22/2015  . Anemia due to chronic kidney disease [N18.9, D63.1] 12/22/2010  . Chronic kidney disease (CKD), stage III (moderate) [N18.3] 12/22/2010  . Benign essential HTN [I10] 12/22/2010  . H/O kidney transplant [Z94.0] 12/22/2010  . Other long term (current) drug therapy [Z79.899] 12/22/2010  . History of pancreas transplant ([place]) [Z94.83] 12/22/2010  . Type 1 diabetes mellitus ([place]) [E10.9] 12/22/2010    Total Time spent with patient: 1 hour  Subjective:   [name] is a 39 y.o. male patient admitted with Suicide attempt by OD.  HPI:  Thank you for asking me to do a psychiatric consult on [name], [name], a 39 year old male, unemployed who denies prior history of mental illness. He reports history of a kidney and pancreas transplant, he is a diabetic, he is a former smoker, he is immunosuppressed on Prograf and prednisone and CellCept. History is obtained from him in the presence of his room mates whom he gave permission to be present in the room. Patient room mate reports that he overdosed on 6-9 tablets of Flexeril, he was brought to the hospital due to loss of consciousness. Patient reports history of subtle depressive symptoms ongoing for almost 2 years which got worse in the last 6 months after his wife of 6 years asked for separation and divorce. He states that he has been stressed out and overwhelmed since then. He also  reports that December is the anniversary of his father who died about 13 years ago. Patient denies prior history of suicide attempt but reports increasingly worse depressive symptoms, difficulty sleeping, poor appetite and occasional feeling of hopelessness. Today, patient denies suicidal thoughts, psychosis or delusional thinking and agreed to seek help at [facility] upon discharge from medical floor.  Past Psychiatric History: None report  Risk to Self:  no Risk to Others:   Prior Inpatient Therapy:   Prior Outpatient Therapy:    Past Medical History:  Past Medical History  Diagnosis Date  . Osteoporosis   . Renal disorder   . Diabetes mellitus   . Hypertension   . GERD (gastroesophageal reflux disease)   . Depression   . Renal failure     Past Surgical History  Procedure Laterality Date  . Nephrectomy transplanted organ    . Combined kidney-pancreas transplant     Family History: History reviewed. No pertinent family history. Family Psychiatric  History:  Social History:  History  Alcohol Use No     History  Drug Use No    Social History   Social History  . Marital Status: Married    Spouse Name: N/A  . Number of Children: N/A  . Years of Education: N/A   Social History Main Topics  . Smoking status: Former Smoker -- 0.50 packs/day  . Smokeless tobacco: None  . Alcohol Use: No  . Drug Use: No  . Sexual Activity: Not Asked   Other Topics Concern  . None   Social History Narrative   Additional Social History:  Allergies:   Allergies  Allergen Reactions  . Amoxicillin Nausea And Vomiting  . Penicillins Nausea And Vomiting    Has patient had a PCN reaction causing immediate rash, facial/tongue/throat swelling, SOB or lightheadedness with hypotension: No Has patient had a PCN reaction causing severe rash involving mucus membranes or skin necrosis: No Has patient had a PCN reaction that required hospitalization No Has  patient had a PCN reaction occurring within the last 10 years: Yes If all of the above answers are "NO", then may proceed with Cephalosporin use.   . Tape Other (See Comments)    Mild irritation with plastic tape    Labs:  Results for orders placed or performed during the hospital encounter of 01/22/15 (from the past 48 hour(s))  CBG monitoring, ED     Status: None   Collection Time: 01/22/15  2:33 PM  Result Value Ref Range   Glucose-Capillary 93 65 - 99 mg/dL  CBC with Differential/Platelet     Status: Abnormal   Collection Time: 01/22/15  3:00 PM  Result Value Ref Range   WBC 11.2 (H) 4.0 - 10.5 K/uL   RBC 5.72 4.22 - 5.81 MIL/uL   Hemoglobin 15.5 13.0 - 17.0 g/dL   HCT 46.6 39.0 - 52.0 %   MCV 81.5 78.0 - 100.0 fL   MCH 27.1 26.0 - 34.0 pg   MCHC 33.3 30.0 - 36.0 g/dL   RDW 14.8 11.5 - 15.5 %   Platelets 255 150 - 400 K/uL   Neutrophils Relative % 82 %   Neutro Abs 9.2 (H) 1.7 - 7.7 K/uL   Lymphocytes Relative 11 %   Lymphs Abs 1.2 0.7 - 4.0 K/uL   Monocytes Relative 6 %   Monocytes Absolute 0.7 0.1 - 1.0 K/uL   Eosinophils Relative 1 %   Eosinophils Absolute 0.1 0.0 - 0.7 K/uL   Basophils Relative 0 %   Basophils Absolute 0.0 0.0 - 0.1 K/uL  Comprehensive metabolic panel     Status: Abnormal   Collection Time: 01/22/15  3:00 PM  Result Value Ref Range   Sodium 137 135 - 145 mmol/L   Potassium 4.7 3.5 - 5.1 mmol/L   Chloride 103 101 - 111 mmol/L   CO2 22 22 - 32 mmol/L   Glucose, Bld 98 65 - 99 mg/dL   BUN 18 6 - 20 mg/dL   Creatinine, Ser 1.92 (H) 0.61 - 1.24 mg/dL   Calcium 10.0 8.9 - 10.3 mg/dL   Total Protein 7.9 6.5 - 8.1 g/dL   Albumin 4.7 3.5 - 5.0 g/dL   AST 27 15 - 41 U/L   ALT 20 17 - 63 U/L   Alkaline Phosphatase 159 (H) 38 - 126 U/L   Total Bilirubin 0.7 0.3 - 1.2 mg/dL   GFR calc non Af Amer 42 (L) >60 mL/min   GFR calc Af Amer 49 (L) >60 mL/min    Comment: (NOTE) The eGFR has been calculated using the CKD EPI equation. This calculation has not  been validated in all clinical situations. eGFR's persistently <60 mL/min signify possible Chronic Kidney Disease.    Anion gap 12 5 - 15  Ethanol     Status: None   Collection Time: 01/22/15  3:00 PM  Result Value Ref Range   Alcohol, Ethyl (B) <5 <5 mg/dL    Comment:        LOWEST DETECTABLE LIMIT FOR SERUM ALCOHOL IS 5 mg/dL FOR MEDICAL PURPOSES ONLY   Salicylate level  Status: None   Collection Time: 01/22/15  3:00 PM  Result Value Ref Range   Salicylate Lvl <7.8 2.8 - 30.0 mg/dL  Acetaminophen level     Status: Abnormal   Collection Time: 01/22/15  3:00 PM  Result Value Ref Range   Acetaminophen (Tylenol), Serum <10 (L) 10 - 30 ug/mL    Comment:        THERAPEUTIC CONCENTRATIONS VARY SIGNIFICANTLY. A RANGE OF 10-30 ug/mL MAY BE AN EFFECTIVE CONCENTRATION FOR MANY PATIENTS. HOWEVER, SOME ARE BEST TREATED AT CONCENTRATIONS OUTSIDE THIS RANGE. ACETAMINOPHEN CONCENTRATIONS >150 ug/mL AT 4 HOURS AFTER INGESTION AND >50 ug/mL AT 12 HOURS AFTER INGESTION ARE OFTEN ASSOCIATED WITH TOXIC REACTIONS.   CK     Status: None   Collection Time: 01/22/15  3:00 PM  Result Value Ref Range   Total CK 157 49 - 397 U/L  Triglycerides     Status: None   Collection Time: 01/22/15  3:00 PM  Result Value Ref Range   Triglycerides 130 <150 mg/dL    Comment: Performed at [facility]  I-stat troponin, ED     Status: None   Collection Time: 01/22/15  3:08 PM  Result Value Ref Range   Troponin i, poc 0.03 0.00 - 0.08 ng/mL   Comment 3            Comment: Due to the release kinetics of cTnI, a negative result within the first hours of the onset of symptoms does not rule out myocardial infarction with certainty. If myocardial infarction is still suspected, repeat the test at appropriate intervals.   CBG monitoring, ED     Status: None   Collection Time: 01/22/15  3:43 PM  Result Value Ref Range   Glucose-Capillary 88 65 - 99 mg/dL  Urinalysis, Routine w reflex  microscopic (not at [facility])     Status: Abnormal   Collection Time: 01/22/15  4:15 PM  Result Value Ref Range   Color, Urine YELLOW YELLOW   APPearance CLEAR CLEAR   Specific Gravity, Urine 1.013 1.005 - 1.030   pH 7.0 5.0 - 8.0   Glucose, UA NEGATIVE NEGATIVE mg/dL   Hgb urine dipstick NEGATIVE NEGATIVE   Bilirubin Urine NEGATIVE NEGATIVE   Ketones, ur NEGATIVE NEGATIVE mg/dL   Protein, ur 30 (A) NEGATIVE mg/dL   Nitrite NEGATIVE NEGATIVE   Leukocytes, UA NEGATIVE NEGATIVE  Urine rapid drug screen (hosp performed)     Status: Abnormal   Collection Time: 01/22/15  4:15 PM  Result Value Ref Range   Opiates NONE DETECTED NONE DETECTED   Cocaine NONE DETECTED NONE DETECTED   Benzodiazepines NONE DETECTED NONE DETECTED   Amphetamines NONE DETECTED NONE DETECTED   Tetrahydrocannabinol POSITIVE (A) NONE DETECTED   Barbiturates NONE DETECTED NONE DETECTED    Comment:        DRUG SCREEN FOR MEDICAL PURPOSES ONLY.  IF CONFIRMATION IS NEEDED FOR ANY PURPOSE, NOTIFY LAB WITHIN 5 DAYS.        LOWEST DETECTABLE LIMITS FOR URINE DRUG SCREEN Drug Class       Cutoff (ng/mL) Amphetamine      1000 Barbiturate      200 Benzodiazepine   978 Tricyclics       478 Opiates          300 Cocaine          300 THC              50   Urine microscopic-add on  Status: Abnormal   Collection Time: 01/22/15  4:15 PM  Result Value Ref Range   Squamous Epithelial / LPF 0-5 (A) NONE SEEN   WBC, UA 0-5 0 - 5 WBC/hpf   RBC / HPF 0-5 0 - 5 RBC/hpf   Bacteria, UA RARE (A) NONE SEEN  CBG monitoring, ED     Status: None   Collection Time: 01/22/15  5:28 PM  Result Value Ref Range   Glucose-Capillary 98 65 - 99 mg/dL  Blood gas, arterial     Status: Abnormal   Collection Time: 01/22/15  8:00 PM  Result Value Ref Range   FIO2 1.00    Delivery systems VENTILATOR    Mode PRESSURE REGULATED VOLUME CONTROL    VT 500 mL   LHR 20 resp/min   Peep/cpap 5.0 cm H20   pH, Arterial 7.519 (H) 7.350 - 7.450    pCO2 arterial 24.3 (L) 35.0 - 45.0 mmHg   pO2, Arterial 246 (H) 80.0 - 100.0 mmHg   Bicarbonate 19.7 (L) 20.0 - 24.0 mEq/L   TCO2 17.1 0 - 100 mmol/L   Acid-base deficit 1.4 0.0 - 2.0 mmol/L   O2 Saturation 99.5 %   Patient temperature 98.3    Collection site BRACHIAL ARTERY    Drawn by [phone]    Sample type ARTERIAL DRAW    Allens test (pass/fail) PASS PASS  Glucose, capillary     Status: None   Collection Time: 01/22/15  8:14 PM  Result Value Ref Range   Glucose-Capillary 97 65 - 99 mg/dL  MRSA PCR Screening     Status: None   Collection Time: 01/22/15  8:26 PM  Result Value Ref Range   MRSA by PCR NEGATIVE NEGATIVE    Comment:        The GeneXpert MRSA Assay (FDA approved for NASAL specimens only), is one component of a comprehensive MRSA colonization surveillance program. It is not intended to diagnose MRSA infection nor to guide or monitor treatment for MRSA infections.   Glucose, capillary     Status: None   Collection Time: 01/22/15 11:54 PM  Result Value Ref Range   Glucose-Capillary 75 65 - 99 mg/dL  Blood gas, arterial     Status: Abnormal   Collection Time: 01/23/15  3:20 AM  Result Value Ref Range   FIO2 0.40    Delivery systems VENTILATOR    Mode PRESSURE REGULATED VOLUME CONTROL    VT 500 mL   LHR 16 resp/min   Peep/cpap 5.0 cm H20   pH, Arterial 7.467 (H) 7.350 - 7.450   pCO2 arterial 26.8 (L) 35.0 - 45.0 mmHg   pO2, Arterial 200 (H) 80.0 - 100.0 mmHg   Bicarbonate 19.1 (L) 20.0 - 24.0 mEq/L   TCO2 16.9 0 - 100 mmol/L   Acid-base deficit 2.9 (H) 0.0 - 2.0 mmol/L   O2 Saturation 99.3 %   Patient temperature 37.0    Collection site RIGHT RADIAL    Drawn by [phone]    Sample type ARTERIAL DRAW    Allens test (pass/fail) PASS PASS  Glucose, capillary     Status: None   Collection Time: 01/23/15  4:36 AM  Result Value Ref Range   Glucose-Capillary 72 65 - 99 mg/dL  Comprehensive metabolic panel     Status: Abnormal   Collection Time: 01/23/15  5:30  AM  Result Value Ref Range   Sodium 138 135 - 145 mmol/L   Potassium 3.8 3.5 - 5.1 mmol/L  Comment: RESULT REPEATED AND VERIFIED DELTA CHECK NOTED    Chloride 108 101 - 111 mmol/L   CO2 22 22 - 32 mmol/L   Glucose, Bld 75 65 - 99 mg/dL   BUN 19 6 - 20 mg/dL   Creatinine, Ser 1.92 (H) 0.61 - 1.24 mg/dL   Calcium 9.2 8.9 - 10.3 mg/dL   Total Protein 6.3 (L) 6.5 - 8.1 g/dL   Albumin 3.6 3.5 - 5.0 g/dL   AST 25 15 - 41 U/L   ALT 15 (L) 17 - 63 U/L   Alkaline Phosphatase 127 (H) 38 - 126 U/L   Total Bilirubin 0.8 0.3 - 1.2 mg/dL   GFR calc non Af Amer 42 (L) >60 mL/min   GFR calc Af Amer 49 (L) >60 mL/min    Comment: (NOTE) The eGFR has been calculated using the CKD EPI equation. This calculation has not been validated in all clinical situations. eGFR's persistently <60 mL/min signify possible Chronic Kidney Disease.    Anion gap 8 5 - 15  Magnesium     Status: None   Collection Time: 01/23/15  5:30 AM  Result Value Ref Range   Magnesium 1.8 1.7 - 2.4 mg/dL  Phosphorus     Status: Abnormal   Collection Time: 01/23/15  5:30 AM  Result Value Ref Range   Phosphorus 2.4 (L) 2.5 - 4.6 mg/dL  CBC     Status: Abnormal   Collection Time: 01/23/15  5:30 AM  Result Value Ref Range   WBC 9.9 4.0 - 10.5 K/uL   RBC 4.73 4.22 - 5.81 MIL/uL   Hemoglobin 12.3 (L) 13.0 - 17.0 g/dL    Comment: REPEATED TO VERIFY DELTA CHECK NOTED    HCT 38.8 (L) 39.0 - 52.0 %   MCV 82.0 78.0 - 100.0 fL   MCH 26.0 26.0 - 34.0 pg   MCHC 31.7 30.0 - 36.0 g/dL   RDW 14.8 11.5 - 15.5 %   Platelets 242 150 - 400 K/uL  Glucose, capillary     Status: None   Collection Time: 01/23/15  7:43 AM  Result Value Ref Range   Glucose-Capillary 79 65 - 99 mg/dL  Glucose, capillary     Status: None   Collection Time: 01/23/15 11:50 AM  Result Value Ref Range   Glucose-Capillary 74 65 - 99 mg/dL   Comment 1 Notify RN    Comment 2 Document in Chart   Glucose, capillary     Status: None   Collection Time: 01/23/15   3:46 PM  Result Value Ref Range   Glucose-Capillary 70 65 - 99 mg/dL  Glucose, capillary     Status: None   Collection Time: 01/23/15 10:01 PM  Result Value Ref Range   Glucose-Capillary 94 65 - 99 mg/dL  Glucose, capillary     Status: None   Collection Time: 01/24/15 12:04 AM  Result Value Ref Range   Glucose-Capillary 87 65 - 99 mg/dL  Glucose, capillary     Status: None   Collection Time: 01/24/15  4:06 AM  Result Value Ref Range   Glucose-Capillary 77 65 - 99 mg/dL  CBC     Status: Abnormal   Collection Time: 01/24/15  5:30 AM  Result Value Ref Range   WBC 9.8 4.0 - 10.5 K/uL   RBC 4.37 4.22 - 5.81 MIL/uL   Hemoglobin 11.4 (L) 13.0 - 17.0 g/dL   HCT 36.2 (L) 39.0 - 52.0 %   MCV 82.8 78.0 - 100.0 fL  MCH 26.1 26.0 - 34.0 pg   MCHC 31.5 30.0 - 36.0 g/dL   RDW 14.9 11.5 - 15.5 %   Platelets 221 150 - 400 K/uL  Basic metabolic panel     Status: Abnormal   Collection Time: 01/24/15  5:30 AM  Result Value Ref Range   Sodium 138 135 - 145 mmol/L   Potassium 4.2 3.5 - 5.1 mmol/L   Chloride 109 101 - 111 mmol/L   CO2 20 (L) 22 - 32 mmol/L   Glucose, Bld 78 65 - 99 mg/dL   BUN 24 (H) 6 - 20 mg/dL   Creatinine, Ser 1.86 (H) 0.61 - 1.24 mg/dL   Calcium 8.5 (L) 8.9 - 10.3 mg/dL   GFR calc non Af Amer 44 (L) >60 mL/min   GFR calc Af Amer 51 (L) >60 mL/min    Comment: (NOTE) The eGFR has been calculated using the CKD EPI equation. This calculation has not been validated in all clinical situations. eGFR's persistently <60 mL/min signify possible Chronic Kidney Disease.    Anion gap 9 5 - 15  Magnesium     Status: Abnormal   Collection Time: 01/24/15  5:30 AM  Result Value Ref Range   Magnesium 1.5 (L) 1.7 - 2.4 mg/dL  Phosphorus     Status: None   Collection Time: 01/24/15  5:30 AM  Result Value Ref Range   Phosphorus 3.9 2.5 - 4.6 mg/dL  Glucose, capillary     Status: None   Collection Time: 01/24/15  7:56 AM  Result Value Ref Range   Glucose-Capillary 74 65 - 99  mg/dL    Current Facility-Administered Medications  Medication Dose Route Frequency Provider Last Rate Last Dose  . heparin injection 5,000 Units  5,000 Units Subcutaneous 3 times per day Chesley Mires, MD   5,000 Units at 01/24/15 0536  . insulin aspart (novoLOG) injection 0-15 Units  0-15 Units Subcutaneous TID AC & HS Elsie Stain, MD      . labetalol (NORMODYNE) tablet 300 mg  300 mg Oral BID Elsie Stain, MD   300 mg at 01/24/15 0761  . multivitamin with minerals tablet 1 tablet  1 tablet Oral Daily Donita Brooks, NP   1 tablet at 01/24/15 [phone]  . mycophenolate (MYFORTIC) EC tablet 720 mg  720 mg Oral BID Donita Brooks, NP   720 mg at 01/24/15 0953  . omega-3 acid ethyl esters (LOVAZA) capsule 1 g  1 g Oral Daily Donita Brooks, NP   1 g at 01/24/15 4373  . predniSONE (DELTASONE) tablet 5 mg  5 mg Oral Daily Donita Brooks, NP   5 mg at 01/24/15 5789  . [START ON 01/26/2015] sulfamethoxazole-trimethoprim (BACTRIM DS,SEPTRA DS) 800-160 MG per tablet 1 tablet  1 tablet Oral Once per day on Mon Thu Brandi L Ollis, NP      . tacrolimus (PROGRAF) capsule 3 mg  3 mg Oral BID Donita Brooks, NP   3 mg at 01/24/15 7847    Musculoskeletal: Strength & Muscle Tone: within normal limits Gait & Station: unsteady Patient leans: Front  Psychiatric Specialty Exam: Review of Systems  Constitutional: Positive for malaise/fatigue.  HENT: Negative.   Eyes: Negative.   Respiratory: Negative.   Cardiovascular: Negative.   Gastrointestinal: Negative.   Genitourinary: Negative.   Musculoskeletal: Positive for myalgias.  Skin: Negative.   Neurological: Negative.   Endo/Heme/Allergies: Negative.   Psychiatric/Behavioral: Positive for depression. The patient has insomnia.  Blood pressure 182/98, pulse 90, temperature 98 F (36.7 C), temperature source Oral, resp. rate 13, height 5' 5"  (1.651 m), weight 51.5 kg (113 lb 8.6 oz), SpO2 100 %.Body mass index is 18.89 kg/(m^2).  General  Appearance: Casual  Eye Contact::  Good  Speech:  Clear and Coherent  Volume:  Decreased  Mood:  Dysphoric  Affect:  Constricted  Thought Process:  Goal Directed  Orientation:  Full (Time, Place, and Person)  Thought Content:  Negative  Suicidal Thoughts:  No  Homicidal Thoughts:  No  Memory:  Immediate;   Good Recent;   Good Remote;   Good  Judgement:  Fair  Insight:  Fair  Psychomotor Activity:  Normal  Concentration:  Good  Recall:  [place] of Knowledge:Good  Language: Good  Akathisia:  No  Handed:  Right  AIMS (if indicated):     Assets:  Communication Skills Desire for Improvement  ADL's:  Intact  Cognition: WNL  Sleep:   fair   Treatment Plan Summary: Daily contact with patient to assess and evaluate symptoms and progress in treatment.  Medication management: -Start Prozac 1m daily for depression  Disposition:  -No evidence of imminent risk to self or others at present.   -Supportive therapy provided about ongoing stressors. -[facility] [place]  ACorena Pilgrim MD 01/24/2015 11:38 AM

## 2015-01-24 NOTE — Progress Notes (Signed)
Patient arrived to floor.  A&O x4.  No complaints.  Denies pain.  Room air.  Peripheral IV intact and SL.  Heart murmur auscultated upon assessment.

## 2015-01-24 NOTE — Progress Notes (Signed)
PULMONARY / CRITICAL CARE MEDICINE   Name: [name] MRN: [id number] DOB: Dec 04, 1975    ADMISSION DATE:  01/22/2015  REFERRING MD:  ER  CHIEF COMPLAINT:  Unresponsive  HISTORY OF PRESENT ILLNESS:   Pt unable to provide hx.    39 yo male brought to ER unresponsive.  Reported to have suicidal ideation.  Roommate told EMS he took 10 to 15 flexeril from unlabeled bottle.  Pt also reported have posted message on facebook about his planned suicide.  Pt had episode of vomiting this morning and had snoring respirations.  He was also noted to have jerking body movements.  Reported to also have taken methocarbamol.  He was intubated and lined in ED.  After intubation he became agitated, combative.  He was then started on diprivan and paralyzed.  SUBJECTIVE:  Awake and alert, extubated 12/16  VITAL SIGNS: BP 170/92 mmHg  Pulse 79  Temp(Src) 98 F (36.7 C) (Oral)  Resp 14  Ht 5\' 5"  (1.651 m)  Wt 51.5 kg (113 lb 8.6 oz)  BMI 18.89 kg/m2  SpO2 100%     VENTILATOR SETTINGS: OFF VENT. ON [place] 2L   INTAKE / OUTPUT: I/O last 3 completed shifts: In: 3614.7 [P.O.:840; I.V.:2774.7] Out: 1600 [Urine:1475; Other:125]  PHYSICAL EXAMINATION: General:  Alert, states did try to commit suicide Neuro:  Opens eyes to commands, no focal deficits, moves all 4 extremities HEENT: PERRLA,  Cardiovascular:  [place] S2, regular rate and rhythm, no MRG Lungs:  No wheeze or crackles Abdomen:  Soft, non tender Musculoskeletal:  No edema, decreased muscle bulk Skin:  Multiple tattoos  LABS:  BMET  Recent Labs Lab 01/22/15 1500 01/23/15 0530 01/24/15 0530  NA 137 138 138  K 4.7 3.8 4.2  CL 103 108 109  CO2 22 22 20*  BUN 18 19 24*  CREATININE 1.92* 1.92* 1.86*  GLUCOSE 98 75 78    Electrolytes  Recent Labs Lab 01/22/15 1500 01/23/15 0530 01/24/15 0530  CALCIUM 10.0 9.2 8.5*  MG  --  1.8 1.5*  PHOS  --  2.4* 3.9    CBC  Recent Labs Lab 01/22/15 1500 01/23/15 0530 01/24/15 0530   WBC 11.2* 9.9 9.8  HGB 15.5 12.3* 11.4*  HCT 46.6 38.8* 36.2*  PLT 255 242 221    Coag's No results for input(s): APTT, INR in the last 168 hours.  Sepsis Markers No results for input(s): LATICACIDVEN, PROCALCITON, O2SATVEN in the last 168 hours.  ABG  Recent Labs Lab 01/22/15 2000 01/23/15 0320  PHART 7.519* 7.467*  PCO2ART 24.3* 26.8*  PO2ART 246* 200*    Liver Enzymes  Recent Labs Lab 01/22/15 1500 01/23/15 0530  AST 27 25  ALT 20 15*  ALKPHOS 159* 127*  BILITOT 0.7 0.8  ALBUMIN 4.7 3.6    Cardiac Enzymes No results for input(s): TROPONINI, PROBNP in the last 168 hours.  Glucose  Recent Labs Lab 01/23/15 1150 01/23/15 1546 01/23/15 2201 01/24/15 0004 01/24/15 0406 01/24/15 0756  GLUCAP 74 70 94 87 77 74    Imaging Dg Chest Port 1 View  01/24/2015  CLINICAL DATA:  Acute respiratory failure EXAM: PORTABLE CHEST - 1 VIEW COMPARISON:  01/23/2015 FINDINGS: Endotracheal tube and nasogastric catheter been removed. A right jugular central line is again seen and stable. Cardiac shadow is stable. The lungs are well aerated with patchy right basilar atelectasis. No bony abnormality is noted. IMPRESSION: Right basilar atelectasis. Electronically Signed   By: Alcide CleverMark  Lukens M.D.   On: 01/24/2015  08:03     STUDIES:  12/15 CT head/neck >> no acute findings  CULTURES:  ANTIBIOTICS:  SIGNIFICANT EVENTS: 12/15 Admit  LINES/TUBES: 12/15 ETT >>12/16 12/15 CVL (ER) >>12/17  DISCUSSION: 39 yo with suicide attempt from intentional overdose complicated by VDRF and possible seizure.  He has hx of CKD stage V s/p renal transplant in 2004, and pancreas transplant in 2005, DM, HTN, GERD. Admitted with intentional drug overdose, suicide attempt. He took approximately 10-15 tablets of methocarbamol. Now improved 12/17 and ready for tfr to floor and psych eval  Get foley and cvl out   ASSESSMENT / PLAN:  PULMONARY A: Acute respiratory failure. D/t  overdose Resolved Off vent P:   Titrate oxygen to off Mobilize  CARDIOVASCULAR A:  Hx of HTN. P:   Resume  outpt labetalol for now  RENAL A:   Stage V CKD s/p renal transplant in 2004 >> followed at [facility]. P:   resome  prednisone, prograf, mycophenolate  Monitor renal fx, urine outpt, electrolytes  GASTROINTESTINAL A:   Hx of GERD. S/p pancreas transplant 2005. P:   Adv diet  HEMATOLOGIC A:   No acute issues. P:  F/u CBC SQ heparin for DVT prophylaxis  INFECTIOUS A:   PCP prophylaxis. P:   Resume  outpt bactrim for now  ENDOCRINE A:   DM type I with renal complications.   P:   SSI  Tid hs   NEUROLOGIC A:   Acute encephalopathy 2nd to intentional drug overdose. Acute depression secondary to ongoing divorce proceeding P:     psychiatry assessment called . Bedside sitter   FAMILY: I spoke with his mother Lyla Son [phone]) this AM 12/17  who lives in the [place]. She told me that the patient had been depressed because of ongoing divorce from his wife. She said that the final decision would probably come through at any time. She is the decision maker at this point.   CC time- 35 mins  Caryl Bis  [phone]  Cell  [phone]  If no response or cell goes to voicemail, call beeper [phone]   01/24/2015, 9:14 AM

## 2015-01-24 NOTE — Progress Notes (Signed)
Called and gave report to Maralyn Sagoerressa RN to go to room 1514.  Erick Blinksuchman, Jannely Henthorn D, RN

## 2015-01-25 ENCOUNTER — Encounter (HOSPITAL_COMMUNITY): Payer: Self-pay

## 2015-01-25 DIAGNOSIS — F321 Major depressive disorder, single episode, moderate: Secondary | ICD-10-CM

## 2015-01-25 DIAGNOSIS — J9600 Acute respiratory failure, unspecified whether with hypoxia or hypercapnia: Secondary | ICD-10-CM | POA: Insufficient documentation

## 2015-01-25 LAB — CBC WITH DIFFERENTIAL/PLATELET
Basophils Absolute: 0 10*3/uL (ref 0.0–0.1)
Basophils Relative: 0 %
EOS PCT: 3 %
Eosinophils Absolute: 0.2 10*3/uL (ref 0.0–0.7)
HEMATOCRIT: 36.1 % — AB (ref 39.0–52.0)
Hemoglobin: 11.5 g/dL — ABNORMAL LOW (ref 13.0–17.0)
LYMPHS ABS: 1.7 10*3/uL (ref 0.7–4.0)
LYMPHS PCT: 25 %
MCH: 26.3 pg (ref 26.0–34.0)
MCHC: 31.9 g/dL (ref 30.0–36.0)
MCV: 82.4 fL (ref 78.0–100.0)
MONO ABS: 0.9 10*3/uL (ref 0.1–1.0)
MONOS PCT: 13 %
NEUTROS ABS: 4 10*3/uL (ref 1.7–7.7)
Neutrophils Relative %: 59 %
PLATELETS: 222 10*3/uL (ref 150–400)
RBC: 4.38 MIL/uL (ref 4.22–5.81)
RDW: 14.9 % (ref 11.5–15.5)
WBC: 6.9 10*3/uL (ref 4.0–10.5)

## 2015-01-25 LAB — BASIC METABOLIC PANEL
ANION GAP: 9 (ref 5–15)
BUN: 29 mg/dL — AB (ref 6–20)
CHLORIDE: 106 mmol/L (ref 101–111)
CO2: 21 mmol/L — AB (ref 22–32)
Calcium: 9.2 mg/dL (ref 8.9–10.3)
Creatinine, Ser: 1.79 mg/dL — ABNORMAL HIGH (ref 0.61–1.24)
GFR calc Af Amer: 53 mL/min — ABNORMAL LOW (ref >60)
GFR, EST NON AFRICAN AMERICAN: 46 mL/min — AB (ref >60)
GLUCOSE: 80 mg/dL (ref 65–99)
POTASSIUM: 3.8 mmol/L (ref 3.5–5.1)
Sodium: 136 mmol/L (ref 135–145)

## 2015-01-25 LAB — GLUCOSE, CAPILLARY: Glucose-Capillary: 78 mg/dL (ref 65–99)

## 2015-01-25 NOTE — Progress Notes (Signed)
Verbal order from Dr. Tyson AliasFeinstein to discontinue heparin, accuchecks, and sliding scale insulin.

## 2015-01-25 NOTE — Progress Notes (Signed)
PULMONARY / CRITICAL CARE MEDICINE   Name: Kelli Hopengus Ottley MRN: [id number] DOB: [birth year]    ADMISSION DATE:  01/22/2015  REFERRING MD:  ER  CHIEF COMPLAINT:  Unresponsive  HISTORY OF PRESENT ILLNESS:   Pt unable to provide hx.    39 yo male brought to ER unresponsive.  Reported to have suicidal ideation.  Roommate told EMS he took 10 to 15 flexeril from unlabeled bottle.  Pt also reported have posted message on facebook about his planned suicide.  Pt had episode of vomiting this morning and had snoring respirations.  He was also noted to have jerking body movements.  Reported to also have taken methocarbamol.  He was intubated and lined in ED.  After intubation he became agitated, combative.  He was then started on diprivan and paralyzed.  SUBJECTIVE:  No distress  VITAL SIGNS: BP 151/86 mmHg  Pulse 80  Temp(Src) 98 F (36.7 C) (Oral)  Resp 18  Ht 5\' 5"  (1.651 m)  Wt 53.1 kg (117 lb 1 oz)  BMI 19.48 kg/m2  SpO2 98%     VENTILATOR SETTINGS: OFF VENT. ON [place] 2L   INTAKE / OUTPUT: I/O last 3 completed shifts: In: 1140 [P.O.:390; I.V.:750] Out: 1350 [Urine:1350]  PHYSICAL EXAMINATION: General:  Alert, calm, no distress Neuro:  Opens eyes to commands, no focal deficits, moves all 4 extremities HEENT: PERR Cardiovascular:  s1 s 2rrr Lungs:  No wheeze or crackles Abdomen:  Soft, non tender Musculoskeletal:  No edema, decreased muscle bulk Skin:  Multiple tattoos  LABS:  BMET  Recent Labs Lab 01/23/15 0530 01/24/15 0530 01/25/15 0521  NA 138 138 136  K 3.8 4.2 3.8  CL 108 109 106  CO2 22 20* 21*  BUN 19 24* 29*  CREATININE 1.92* 1.86* 1.79*  GLUCOSE 75 78 80    Electrolytes  Recent Labs Lab 01/23/15 0530 01/24/15 0530 01/25/15 0521  CALCIUM 9.2 8.5* 9.2  MG 1.8 1.5*  --   PHOS 2.4* 3.9  --     CBC  Recent Labs Lab 01/23/15 0530 01/24/15 0530 01/25/15 0521  WBC 9.9 9.8 6.9  HGB 12.3* 11.4* 11.5*  HCT 38.8* 36.2* 36.1*  PLT 242 221 222     Coag's No results for input(s): APTT, INR in the last 168 hours.  Sepsis Markers No results for input(s): LATICACIDVEN, PROCALCITON, O2SATVEN in the last 168 hours.  ABG  Recent Labs Lab 01/22/15 2000 01/23/15 0320  PHART 7.519* 7.467*  PCO2ART 24.3* 26.8*  PO2ART 246* 200*    Liver Enzymes  Recent Labs Lab 01/22/15 1500 01/23/15 0530  AST 27 25  ALT 20 15*  ALKPHOS 159* 127*  BILITOT 0.7 0.8  ALBUMIN 4.7 3.6    Cardiac Enzymes No results for input(s): TROPONINI, PROBNP in the last 168 hours.  Glucose  Recent Labs Lab 01/24/15 0406 01/24/15 0756 01/24/15 1203 01/24/15 1759 01/24/15 2055 01/25/15 0711  GLUCAP 77 74 84 92 116* 78    Imaging No results found.   STUDIES:  12/15 CT head/neck >> no acute findings  CULTURES:  ANTIBIOTICS:  SIGNIFICANT EVENTS: 12/15 Admit  LINES/TUBES: 12/15 ETT >>12/16 12/15 CVL (ER) >>12/17  DISCUSSION: 39 yo with suicide attempt from intentional overdose complicated by VDRF and possible seizure.  He has hx of CKD stage V s/p renal transplant in 2004, and pancreas transplant in 2005, DM, HTN, GERD. Admitted with intentional drug overdose, suicide attempt. He took approximately 10-15 tablets of methocarbamol. Now improved 12/17 and ready for tfr  to floor and psych eval  Get foley and cvl out   ASSESSMENT / PLAN:  PULMONARY A: Acute respiratory failure. D/t overdose Resolved Off vent P:   IS Mobilize  CARDIOVASCULAR A:  Hx of HTN. P:  labetalol   RENAL A:   Stage V CKD s/p renal transplant in 2004 >> followed at [facility]. P:   resome  prednisone, prograf, mycophenolate  Monitor renal fx, urine outpt, electrolytes- appears to be at or better than baseline crt  GASTROINTESTINAL A:   Hx of GERD. S/p pancreas transplant 2005. P:   Adv diet  HEMATOLOGIC A:   No acute issues. P:  F/u CBC SQ heparin for DVT prophylaxis- dc when ambulating  INFECTIOUS A:   PCP prophylaxis. P:   Resume   outpt bactrim   ENDOCRINE A:   DM type I with renal complications.   P:   SSI  Tid hs   NEUROLOGIC A:   Acute encephalopathy 2nd to intentional drug overdose. Acute depression secondary to ongoing divorce proceeding P:     psychiatry assessment called note reviewed, does not appear to be at harm to self per his note but on going assessmens daily  Bedside sitter required still  Mcarthur Rossetti. Tyson Alias, MD, FACP Pgr: [phone] [facility]

## 2015-01-26 ENCOUNTER — Encounter (HOSPITAL_COMMUNITY): Payer: Self-pay | Admitting: Pulmonary Disease

## 2015-01-26 LAB — BASIC METABOLIC PANEL
Anion gap: 8 (ref 5–15)
BUN: 30 mg/dL — AB (ref 6–20)
CHLORIDE: 107 mmol/L (ref 101–111)
CO2: 22 mmol/L (ref 22–32)
Calcium: 9.5 mg/dL (ref 8.9–10.3)
Creatinine, Ser: 1.73 mg/dL — ABNORMAL HIGH (ref 0.61–1.24)
GFR calc Af Amer: 56 mL/min — ABNORMAL LOW (ref >60)
GFR calc non Af Amer: 48 mL/min — ABNORMAL LOW (ref >60)
Glucose, Bld: 80 mg/dL (ref 65–99)
POTASSIUM: 4.1 mmol/L (ref 3.5–5.1)
SODIUM: 137 mmol/L (ref 135–145)

## 2015-01-26 MED ORDER — PREDNISONE 5 MG PO TABS: 5.0000 mg | ORAL_TABLET | Freq: Every day | ORAL | Status: AC

## 2015-01-26 NOTE — Progress Notes (Signed)
Nutrition Follow-up  DOCUMENTATION CODES:   Non-severe (moderate) malnutrition in context of social or environmental circumstances  INTERVENTION:  - RD will continue to monitor for needs  NUTRITION DIAGNOSIS:   Inadequate oral intake related to other (see comment) (food preferences ) as evidenced by other (see comment) (pt reports he does not like the food served.). -revised  GOAL:   Patient will meet greater than or equal to 90% of their needs -unmet due to preferences  MONITOR:   PO intake, Weight trends, Labs, Skin, I & O's  ASSESSMENT:   39 yo male brought to ER unresponsive. Reported to have suicidal ideation. Roommate told EMS he took 10 to 15 flexeril from unlabeled bottle. Pt also reported have posted message on facebook about his planned suicide. Pt had episode of vomiting this morning and had snoring respirations. He was also noted to have jerking body movements. Reported to also have taken methocarbamol. He was intubated and lined in ED. After intubation he became agitated, combative. He was then started on diprivan and paralyzed.  12/19 Pt was extubated ~1230 on 12/16. Chart review indicates 0% intake of all meals yesterday (12/18) and 50% of breakfast this AM. Pt reports that PTA he had a very good appetite and he states that weight was stable PTA; weight changes per note below noted in chart. He states that he does not like the food served in the hospital and so he has not been eating well. Pt has no questions/concerns pertaining to nutrition at this time.   Not meeting needs due to preferences. Medications reviewed. Labs reviewed; CBGs: 70-116 mg/dL, BUN/creatinine elevated, GFR: 48.    12/16 - Patient is currently intubated on ventilator support - MV: 9 L/min; Propofol: none ml/hr - No family/visitors present at time of RD visit to provide information from PTA.  - Chart review indicates weight fluctuations over the past 10 months with most recent  indicating 16 lb weight loss (12% body weight) in the past 5 months, this is significant for time frame.  - Moderate muscle wasting noted to BLE with muscle and fat stores WDL to upper body.    Diet Order:  Diet Carb Modified Fluid consistency:: Thin; Room service appropriate?: Yes  Skin:  Reviewed, no issues  Last BM:  12/19  Height:   Ht Readings from Last 1 Encounters:  01/26/15 5\' 7"  (1.702 m)    Weight:   Wt Readings from Last 1 Encounters:  01/26/15 113 lb 12.1 oz (51.6 kg)    Ideal Body Weight:  56.82 kg (kg)  BMI:  Body mass index is 17.81 kg/(m^2).  Estimated Nutritional Needs:   Kcal:  1605  Protein:  62-78 grams  Fluid:  1.8-2 L/day  EDUCATION NEEDS:   No education needs identified at this time     Trenton GammonJessica Suraiya Dickerson, RD, LDN Inpatient Clinical Dietitian Pager # [phone] After hours/weekend pager # [phone]

## 2015-01-26 NOTE — Discharge Summary (Signed)
Physician Discharge Summary  Patient ID: [name] MRN: [id number] DOB/AGE: 39/22/1977 39 y.o.  Admit date: 01/22/2015 Discharge date: 01/26/2015    Discharge Diagnoses:  Acute Encephalopathy in setting of Intentional Overdose  Acute Depression - situational, ongoing divorce proceedings. Acute Respiratory Failure in the setting of Intentional Overdose HTN Stage V CKD s/p Renal Transplant Hx Pancreatic Transplant GERD                                                                          DISCHARGE PLAN BY DIAGNOSIS     Acute Encephalopathy in setting of Intentional Overdose  Acute Depression - situational, ongoing divorce proceedings.  Discharge Plan: Follow up at [facility] as an outpatient  Verbal contract with patient to return to ER if new suicidal / homicidal feelings   Acute Respiratory Failure in the setting of Intentional Overdose  Discharge Plan: Resolved, no further follow up at this time.   HTN  Discharge Plan: Continue labtetalol   Stage V CKD s/p Renal Transplant Hx Pancreatic Transplant  Discharge Plan: Continue prednisone 35m QD, prograf, mycophenolate Bactrim for PCP prophylaxis   GERD    Discharge Plan: Continue PPI                   DISCHARGE SUMMARY   [name] a 39y.o. y/o male, former smoker, with a PMH of hypertension, renal failure status post transplant in 2004, [facility], prednisone and CellCept),  Depression, GERD and osteoporosis [facility]  His roommate reported on admission that the patient had been having suicidal thoughts for approximately 8 months in the setting of divorce and [place] The patient obtained 10-15 methocarbamol from his mother and ingested the medications. He reportedly posted a message on Facebook about his plan suicide. He  was found in emesis and with snoring respirations. EMS noted jerking movements on initial presentation. The patient required intubation for airway protection.  CT of the head and neck were evaluated which were negative for acute findings. He was admitted to ICU for observation. Chest x-ray demonstrated mild right basilar atelectasis without overt infiltrate. Serum creatinine on admission 1.92 which cleared to 1.73.  The patient was extubated on 12/16 without difficulty. Post extubation he was evaluated by psychiatry who found him to be in no imminent risk to self or others. [facility]             STUDIES:  12/15 CT head/neck >> no acute findings  SIGNIFICANT EVENTS: 12/15  Admit with intentional overdose 12/17  Extubated, evaluated by PSY >> rec's for outpatient follow up   LINES/TUBES: 12/15 ETT >>12/16 12/15 CVL (ER) >>12/17   Discharge Exam: General: Alert, calm, no distress Neuro: AAOx4, speech clear, MAE HEENT: MM pink/moist, no jvd, central line site covered, clean & dry.   Cardiovascular: s1s2 RRR,no m/r/g Lungs: No wheeze or crackles Abdomen: Soft, non tender Musculoskeletal: No edema, decreased muscle bulk Skin: Multiple tattoos  Filed Vitals:   01/26/15 0456 01/26/15 0636 01/26/15 0714 01/26/15 1025  BP:  150/94  150/90  Pulse:  72    Temp:  98.2 F (36.8 C)    TempSrc:  Oral    Resp:  18    Height: 5' 7"  (1.702 m)     Weight: 111 lb 12.4 oz (50.7 kg)  113 lb 12.1 oz (51.6 kg)   SpO2:  100%       Discharge Labs  BMET  Recent Labs Lab 01/22/15 1500 01/23/15 0530 01/24/15 0530 01/25/15 0521 01/26/15 0525  NA 137 138 138 136 137  K 4.7 3.8 4.2 3.8 4.1  CL 103 108 109 106 107  CO2 22 22 20* 21* 22  GLUCOSE 98 75 78 80 80  BUN 18 19 24* 29* 30*  CREATININE 1.92* 1.92* 1.86* 1.79* 1.73*  CALCIUM 10.0 9.2 8.5* 9.2 9.5  MG  --  1.8 1.5*  --   --   PHOS  --  2.4* 3.9  --   --      CBC  Recent Labs Lab 01/23/15 0530 01/24/15 0530 01/25/15 0521  HGB 12.3* 11.4* 11.5*  HCT 38.8* 36.2* 36.1*  WBC 9.9 9.8 6.9  PLT 242 221 222     Discharge Instructions    Call MD for:  difficulty breathing, headache or visual disturbances    Complete by:  As directed      Call MD for:  persistant dizziness or light-headedness    Complete by:  As directed      Call MD for:  persistant nausea and vomiting    Complete by:  As directed      Call MD for:  redness, tenderness, or signs of infection (pain, swelling, redness, odor or green/yellow discharge around incision site)    Complete by:  As directed      Call MD for:  severe uncontrolled pain    Complete by:  As directed      Call MD for:  temperature >100.4    Complete by:  As directed      Diet general    Complete by:  As directed      Discharge instructions    Complete by:  As directed   1.  Go to [facility] to set up an appointment.  They have a walk in clinic. 2.  Review your medications carefully 3.  Keep neck IV site clean and dry.  May remove dressing in am 12/20.     Increase activity slowly    Complete by:  As directed                 Follow-up Information    Schedule an appointment as soon as possible for a visit with Glendon Axe, MD.   Specialty:  Family Medicine   Why:  As needed   Contact information:   [address] [place] [address] [phone]       Follow up with [facility].   Specialty:  Behavioral Health   Why:  Go to the office to be seen.  Walk-in's accepted.    Contact information:   [place] [address] [phone]          Medication List    STOP taking these medications        pantoprazole 40 MG tablet  Commonly known as:  PROTONIX      TAKE these medications        albuterol 108 (90 BASE) MCG/ACT inhaler  Commonly known as:  PROVENTIL HFA;VENTOLIN HFA  Inhale 1-2  puffs into the lungs every 6 (six) hours as needed for wheezing or shortness  of breath.     CALCIUM-VITAMIN D PO  Take 1 tablet by mouth daily.     HYDROcodone-acetaminophen 5-325 MG tablet  Commonly known as:  NORCO  Take 1-2 tablets by mouth every 6 (six) hours as needed for severe pain.     labetalol 300 MG tablet  Commonly known as:  NORMODYNE  Take 300 mg by mouth 2 (two) times daily.     multivitamin with minerals Tabs tablet  Take 1 tablet by mouth daily. Reported on 01/23/2015     mycophenolate 180 MG EC tablet  Commonly known as:  MYFORTIC  TAKE 4 TABLETS BY MOUTH TWICE A DAY     omeprazole 40 MG capsule  Commonly known as:  PRILOSEC  Take 40 mg by mouth 2 (two) times daily.     predniSONE 5 MG tablet  Commonly known as:  DELTASONE  Take 1 tablet (5 mg total) by mouth daily.     sulfamethoxazole-trimethoprim 800-160 MG per tablet  Commonly known as:  BACTRIM DS  Take 1 tablet by mouth 2 (two) times a week. On Mon and Thurs     tacrolimus 1 MG capsule  Commonly known as:  PROGRAF  Take 3 capsules (3 mg total) by mouth 2 times daily.          Disposition: Home.  Patient denies homicidal / suicidal ideations.  Verbally contracted with patient that if he began having feelings of harming himself or others that he report to an ER or [facility].  He agrees to Passenger transport manager.   Discharged Condition: [name] has met maximum benefit of inpatient care and is medically stable and cleared for discharge.  Patient is pending follow up as above.      Time spent on disposition:  Greater than 35 minutes.   Signed: Noe Gens, NP-C [facility] Pgr: [phone] Office: [phone]

## 2015-01-26 NOTE — Progress Notes (Signed)
Pt discharged from the unit via wheelchair. Discharge instructions were reviewed with the pt prior to discharge. IJ dressing was changed according to MD orders prior to discharge. Pt belongings were sent home with family members. No questions or complaints from the pt at this time. Shyheim Tanney W Ugochukwu Chichester, RN

## 2015-01-26 NOTE — Care Management Important Message (Signed)
Important Message  Patient Details  Name: [name] MRN: [id number] Date of Birth: [birth year]   Medicare Important Message Given:  Yes    Haskell FlirtJamison, Blaise Palladino 01/26/2015, 12:05 PMImportant Message  Patient Details  Name: [name] MRN: [id number] Date of Birth: [birth year]   Medicare Important Message Given:  Yes    Haskell FlirtJamison, Brayten Komar 01/26/2015, 12:05 PM

## 2015-02-17 ENCOUNTER — Encounter (HOSPITAL_COMMUNITY): Payer: Self-pay | Admitting: *Deleted

## 2015-02-17 ENCOUNTER — Emergency Department (HOSPITAL_COMMUNITY): Payer: Medicare Other

## 2015-02-17 ENCOUNTER — Emergency Department (HOSPITAL_COMMUNITY)
Admission: EM | Admit: 2015-02-17 | Discharge: 2015-02-17 | Disposition: A | Discharge status: Home / Self Care | Payer: Medicare Other | Attending: Emergency Medicine | Admitting: Emergency Medicine

## 2015-02-17 DIAGNOSIS — K219 Gastro-esophageal reflux disease without esophagitis: Secondary | ICD-10-CM | POA: Insufficient documentation

## 2015-02-17 DIAGNOSIS — R05 Cough: Secondary | ICD-10-CM | POA: Diagnosis present

## 2015-02-17 DIAGNOSIS — Z79899 Other long term (current) drug therapy: Secondary | ICD-10-CM | POA: Diagnosis not present

## 2015-02-17 DIAGNOSIS — M810 Age-related osteoporosis without current pathological fracture: Secondary | ICD-10-CM | POA: Diagnosis not present

## 2015-02-17 DIAGNOSIS — Z940 Kidney transplant status: Secondary | ICD-10-CM | POA: Insufficient documentation

## 2015-02-17 DIAGNOSIS — Z87448 Personal history of other diseases of urinary system: Secondary | ICD-10-CM | POA: Insufficient documentation

## 2015-02-17 DIAGNOSIS — Z9483 Pancreas transplant status: Secondary | ICD-10-CM | POA: Diagnosis not present

## 2015-02-17 DIAGNOSIS — Z8701 Personal history of pneumonia (recurrent): Secondary | ICD-10-CM | POA: Insufficient documentation

## 2015-02-17 DIAGNOSIS — Z7952 Long term (current) use of systemic steroids: Secondary | ICD-10-CM | POA: Diagnosis not present

## 2015-02-17 DIAGNOSIS — Z880 Allergy status to penicillin: Secondary | ICD-10-CM | POA: Diagnosis not present

## 2015-02-17 DIAGNOSIS — Z8659 Personal history of other mental and behavioral disorders: Secondary | ICD-10-CM | POA: Insufficient documentation

## 2015-02-17 DIAGNOSIS — J209 Acute bronchitis, unspecified: Secondary | ICD-10-CM | POA: Diagnosis not present

## 2015-02-17 DIAGNOSIS — J40 Bronchitis, not specified as acute or chronic: Secondary | ICD-10-CM

## 2015-02-17 DIAGNOSIS — R1110 Vomiting, unspecified: Secondary | ICD-10-CM | POA: Diagnosis not present

## 2015-02-17 DIAGNOSIS — I10 Essential (primary) hypertension: Secondary | ICD-10-CM | POA: Insufficient documentation

## 2015-02-17 DIAGNOSIS — F17210 Nicotine dependence, cigarettes, uncomplicated: Secondary | ICD-10-CM | POA: Diagnosis not present

## 2015-02-17 MED ORDER — LEVOFLOXACIN 750 MG PO TABS
750.0000 mg | ORAL_TABLET | Freq: Once | ORAL | Status: AC
  Administered 2015-02-17: 750 mg via ORAL
  Filled 2015-02-17: qty 1

## 2015-02-17 MED ORDER — ALBUTEROL SULFATE (2.5 MG/3ML) 0.083% IN NEBU
5.0000 mg | INHALATION_SOLUTION | Freq: Once | RESPIRATORY_TRACT | Status: AC
  Administered 2015-02-17: 5 mg via RESPIRATORY_TRACT
  Filled 2015-02-17: qty 6

## 2015-02-17 MED ORDER — LEVOFLOXACIN 750 MG PO TABS: 750.0000 mg | ORAL_TABLET | Freq: Every day | ORAL | Status: DC

## 2015-02-17 MED ORDER — ALBUTEROL SULFATE HFA 108 (90 BASE) MCG/ACT IN AERS
2.0000 | INHALATION_SPRAY | RESPIRATORY_TRACT | Status: DC | PRN
  Administered 2015-02-17: 2 via RESPIRATORY_TRACT
  Filled 2015-02-17: qty 6.7

## 2015-02-17 MED ORDER — HYDROCOD POLST-CPM POLST ER 10-8 MG/5ML PO SUER
5.0000 mL | Freq: Once | ORAL | Status: AC
  Administered 2015-02-17: 5 mL via ORAL
  Filled 2015-02-17: qty 5

## 2015-02-17 MED ORDER — IPRATROPIUM BROMIDE 0.02 % IN SOLN
0.5000 mg | Freq: Once | RESPIRATORY_TRACT | Status: AC
  Administered 2015-02-17: 0.5 mg via RESPIRATORY_TRACT
  Filled 2015-02-17: qty 2.5

## 2015-02-17 MED ORDER — HYDROCOD POLST-CPM POLST ER 10-8 MG/5ML PO SUER: 5.0000 mL | Freq: Two times a day (BID) | ORAL | Status: DC | PRN

## 2015-02-17 NOTE — ED Provider Notes (Signed)
CSN: [id number]     Arrival date & time 02/17/15  2132 History  By signing my name below, I, [facility], attest that this documentation has been prepared under the direction and in the presence of Elpidio Anis, PA-C. Electronically Signed: Randell Patient, ED Scribe. 02/17/2015. 10:14 PM.   Chief Complaint  Patient presents with  . URI   The history is provided by the patient and a significant other. No language interpreter was used.   HPI Comments: [name] is a 40 y.o. male with an hx of a combined kidney-pancreas transplant, HTN, and pneumonia who presents to the Emergency Department complaining of intermittent, moderate, gradually worsening nonproductive cough onset 6 days ago, worse in the past 3 days. Significant other reports that patient had a mild cough 6 days ago with mild congestion which has worsened in the past 3 days, followed by generalized body aches, chills, and post-tussive emesis in the past 3 days. She endorses associated intermittent fever TMAX 99. He has taken Theraflu, most recently 9 hours ago, without relief. Per significant other, he has not taken Tylenol or Motrin today and he has an albuterol inhaler which he has not used. He denies wheezing. Patient is a current 1 ppd smoker.   Past Medical History  Diagnosis Date  . Osteoporosis   . Renal disorder   . Hypertension   . GERD (gastroesophageal reflux disease)   . Depression   . Renal failure    Past Surgical History  Procedure Laterality Date  . Nephrectomy transplanted organ    . Combined kidney-pancreas transplant     No family history on file. Social History  Substance Use Topics  . Smoking status: Current Every Day Smoker -- 1.00 packs/day    Types: Cigarettes  . Smokeless tobacco: None  . Alcohol Use: No    Review of Systems  Constitutional: Positive for fever (TMAX 99) and chills.  HENT: Positive for congestion.   Respiratory: Positive for cough. Negative for wheezing.    Cardiovascular: Positive for chest pain (from cough).  Gastrointestinal: Positive for vomiting (Post-tussive).  Musculoskeletal: Negative for myalgias and neck stiffness.  Skin: Negative for rash.      Allergies  Amoxicillin; Penicillins; and Tape  Home Medications   Prior to Admission medications   Medication Sig Start Date End Date Taking? Authorizing Provider  albuterol (PROVENTIL HFA;VENTOLIN HFA) 108 (90 BASE) MCG/ACT inhaler Inhale 1-2 puffs into the lungs every 6 (six) hours as needed for wheezing or shortness of breath. 04/01/14   Tilden Fossa, MD  CALCIUM-VITAMIN D PO Take 1 tablet by mouth daily.    Historical Provider, MD  HYDROcodone-acetaminophen (NORCO) 5-325 MG per tablet Take 1-2 tablets by mouth every 6 (six) hours as needed for severe pain. 10/21/13   Mercedes Camprubi-Soms, PA-C  labetalol (NORMODYNE) 300 MG tablet Take 300 mg by mouth 2 (two) times daily.    Historical Provider, MD  Multiple Vitamin (MULTIVITAMIN WITH MINERALS) TABS Take 1 tablet by mouth daily. Reported on 01/23/2015    Historical Provider, MD  mycophenolate (MYFORTIC) 180 MG EC tablet TAKE 4 TABLETS BY MOUTH TWICE A DAY 01/15/15   Historical Provider, MD  omeprazole (PRILOSEC) 40 MG capsule Take 40 mg by mouth 2 (two) times daily.    Historical Provider, MD  predniSONE (DELTASONE) 5 MG tablet Take 1 tablet (5 mg total) by mouth daily. 01/26/15   Jeanella Craze, NP  sulfamethoxazole-trimethoprim (BACTRIM DS) 800-160 MG per tablet Take 1 tablet by mouth 2 (two) times  a week. On Mon and Thurs    Historical Provider, MD  tacrolimus (PROGRAF) 1 MG capsule Take 3 capsules (3 mg total) by mouth 2 times daily. 01/15/15   Historical Provider, MD   BP 140/84 mmHg  Pulse 107  Temp(Src) 99.1 F (37.3 C) (Oral)  Resp 21  SpO2 94% Physical Exam  Constitutional: He is oriented to person, place, and time. He appears well-developed and well-nourished. No distress.  HENT:  Head: Normocephalic and atraumatic.   Eyes: Conjunctivae and EOM are normal.  Neck: Normal range of motion. Neck supple. No tracheal deviation present.  Cardiovascular: Normal rate and regular rhythm.   Pulmonary/Chest: Effort normal and breath sounds normal. No respiratory distress.  Actively coughing. Coarse breath sounds bilaterally. No wheezing. Full air movement.   Abdominal: Soft. Bowel sounds are normal. There is no tenderness. There is no rebound and no guarding.  Musculoskeletal: Normal range of motion.  Neurological: He is alert and oriented to person, place, and time. No cranial nerve deficit.  Skin: Skin is warm and dry. No rash noted.  Psychiatric: He has a normal mood and affect. His behavior is normal.  Nursing note and vitals reviewed.   ED Course  Procedures   DIAGNOSTIC STUDIES: Oxygen Saturation is 94% on RA, adequate by my interpretation.    COORDINATION OF CARE: 10:07 PM Will order breathing treatment. Discussed treatment plan with pt at bedside and pt agreed to plan.  Labs Review Labs Reviewed - No data to display  Imaging Review No results found. I have personally reviewed and evaluated these images and lab results as part of my medical decision-making.   EKG Interpretation None      MDM   Final diagnoses:  None    1. Bronchitis  Patient with recent pneumonia, heavy smoker with significant cough and complicated medical history. Cough improved with albuterol nebulizer. CXR without infiltrates. Will provide Levaquin, albuterol inhaler. Encouraged close PCP follow up for recheck.   I personally performed the services described in this documentation, which was scribed in my presence. The recorded information has been reviewed and is accurate.    Elpidio AnisShari Jaleya Pebley, PA-C 02/18/15 [id number]  Lavera Guiseana Duo Liu, MD 02/18/15 [phone]

## 2015-02-17 NOTE — ED Notes (Signed)
Pt states that he began having cough and congestion on Friday; pt states that is has progressively gotten worse and now has body aches and chills; no fever reported; pt has a non-productive cough and a clear runny nose

## 2015-02-17 NOTE — Discharge Instructions (Signed)
Smoking Cessation, Tips for Success If you are ready to quit smoking, congratulations! You have chosen to help yourself be healthier. Cigarettes bring nicotine, tar, carbon monoxide, and other irritants into your body. Your lungs, heart, and blood vessels will be able to work better without these poisons. There are many different ways to quit smoking. Nicotine gum, nicotine patches, a nicotine inhaler, or nicotine nasal spray can help with physical craving. Hypnosis, support groups, and medicines help break the habit of smoking. WHAT THINGS CAN I DO TO MAKE QUITTING EASIER?  Here are some tips to help you quit for good:  Pick a date when you will quit smoking completely. Tell all of your friends and family about your plan to quit on that date.  Do not try to slowly cut down on the number of cigarettes you are smoking. Pick a quit date and quit smoking completely starting on that day.  Throw away all cigarettes.   Clean and remove all ashtrays from your home, work, and car.  On a card, write down your reasons for quitting. Carry the card with you and read it when you get the urge to smoke.  Cleanse your body of nicotine. Drink enough water and fluids to keep your urine clear or pale yellow. Do this after quitting to flush the nicotine from your body.  Learn to predict your moods. Do not let a bad situation be your excuse to have a cigarette. Some situations in your life might tempt you into wanting a cigarette.  Never have "just one" cigarette. It leads to wanting another and another. Remind yourself of your decision to quit.  Change habits associated with smoking. If you smoked while driving or when feeling stressed, try other activities to replace smoking. Stand up when drinking your coffee. Brush your teeth after eating. Sit in a different chair when you read the paper. Avoid alcohol while trying to quit, and try to drink fewer caffeinated beverages. Alcohol and caffeine may urge you to  smoke.  Avoid foods and drinks that can trigger a desire to smoke, such as sugary or spicy foods and alcohol.  Ask people who smoke not to smoke around you.  Have something planned to do right after eating or having a cup of coffee. For example, plan to take a walk or exercise.  Try a relaxation exercise to calm you down and decrease your stress. Remember, you may be tense and nervous for the first 2 weeks after you quit, but this will pass.  Find new activities to keep your hands busy. Play with a pen, coin, or rubber band. Doodle or draw things on paper.  Brush your teeth right after eating. This will help cut down on the craving for the taste of tobacco after meals. You can also try mouthwash.   Use oral substitutes in place of cigarettes. Try using lemon drops, carrots, cinnamon sticks, or chewing gum. Keep them handy so they are available when you have the urge to smoke.  When you have the urge to smoke, try deep breathing.  Designate your home as a nonsmoking area.  If you are a heavy smoker, ask your health care provider about a prescription for nicotine chewing gum. It can ease your withdrawal from nicotine.  Reward yourself. Set aside the cigarette money you save and buy yourself something nice.  Look for support from others. Join a support group or smoking cessation program. Ask someone at home or at work to help you with your plan   to quit smoking.  Always ask yourself, "Do I need this cigarette or is this just a reflex?" Tell yourself, "Today, I choose not to smoke," or "I do not want to smoke." You are reminding yourself of your decision to quit.  Do not replace cigarette smoking with electronic cigarettes (commonly called e-cigarettes). The safety of e-cigarettes is unknown, and some may contain harmful chemicals.  If you relapse, do not give up! Plan ahead and think about what you will do the next time you get the urge to smoke. HOW WILL I FEEL WHEN I QUIT SMOKING? You  may have symptoms of withdrawal because your body is used to nicotine (the addictive substance in cigarettes). You may crave cigarettes, be irritable, feel very hungry, cough often, get headaches, or have difficulty concentrating. The withdrawal symptoms are only temporary. They are strongest when you first quit but will go away within 10-14 days. When withdrawal symptoms occur, stay in control. Think about your reasons for quitting. Remind yourself that these are signs that your body is healing and getting used to being without cigarettes. Remember that withdrawal symptoms are easier to treat than the major diseases that smoking can cause.  Even after the withdrawal is over, expect periodic urges to smoke. However, these cravings are generally short lived and will go away whether you smoke or not. Do not smoke! WHAT RESOURCES ARE AVAILABLE TO HELP ME QUIT SMOKING? Your health care provider can direct you to community resources or hospitals for support, which may include:  Group support.  Education.  Hypnosis.  Therapy.   This information is not intended to replace advice given to you by your health care provider. Make sure you discuss any questions you have with your health care provider.   Document Released: 10/23/2003 Document Revised: 02/14/2014 Document Reviewed: 07/12/2012 Elsevier Interactive Patient Education 2016 [employer]  Acute Bronchitis Bronchitis is inflammation of the airways that extend from the windpipe into the lungs (bronchi). The inflammation often causes mucus to develop. This leads to a cough, which is the most common symptom of bronchitis.  In acute bronchitis, the condition usually develops suddenly and goes away over time, usually in a couple weeks. Smoking, allergies, and asthma can make bronchitis worse. Repeated episodes of bronchitis may cause further lung problems.  CAUSES Acute bronchitis is most often caused by the same virus that causes a cold. The virus  can spread from person to person (contagious) through coughing, sneezing, and touching contaminated objects. SIGNS AND SYMPTOMS   Cough.   Fever.   Coughing up mucus.   Body aches.   Chest congestion.   Chills.   Shortness of breath.   Sore throat.  DIAGNOSIS  Acute bronchitis is usually diagnosed through a physical exam. Your health care provider will also ask you questions about your medical history. Tests, such as chest X-rays, are sometimes done to rule out other conditions.  TREATMENT  Acute bronchitis usually goes away in a couple weeks. Oftentimes, no medical treatment is necessary. Medicines are sometimes given for relief of fever or cough. Antibiotic medicines are usually not needed but may be prescribed in certain situations. In some cases, an inhaler may be recommended to help reduce shortness of breath and control the cough. A cool mist vaporizer may also be used to help thin bronchial secretions and make it easier to clear the chest.  HOME CARE INSTRUCTIONS  Get plenty of rest.   Drink enough fluids to keep your urine clear or pale yellow (unless   you have a medical condition that requires fluid restriction). Increasing fluids may help thin your respiratory secretions (sputum) and reduce chest congestion, and it will prevent dehydration.   Take medicines only as directed by your health care provider.  If you were prescribed an antibiotic medicine, finish it all even if you start to feel better.  Avoid smoking and secondhand smoke. Exposure to cigarette smoke or irritating chemicals will make bronchitis worse. If you are a smoker, consider using nicotine gum or skin patches to help control withdrawal symptoms. Quitting smoking will help your lungs heal faster.   Reduce the chances of another bout of acute bronchitis by washing your hands frequently, avoiding people with cold symptoms, and trying not to touch your hands to your mouth, nose, or eyes.   Keep  all follow-up visits as directed by your health care provider.  SEEK MEDICAL CARE IF: Your symptoms do not improve after 1 week of treatment.  SEEK IMMEDIATE MEDICAL CARE IF:  You develop an increased fever or chills.   You have chest pain.   You have severe shortness of breath.  You have bloody sputum.   You develop dehydration.  You faint or repeatedly feel like you are going to pass out.  You develop repeated vomiting.  You develop a severe headache. MAKE SURE YOU:   Understand these instructions.  Will watch your condition.  Will get help right away if you are not doing well or get worse.   This information is not intended to replace advice given to you by your health care provider. Make sure you discuss any questions you have with your health care provider.   Document Released: 03/03/2004 Document Revised: 02/14/2014 Document Reviewed: 07/17/2012 Elsevier Interactive Patient Education 2016 [employer] Inc[employer]  

## 2015-03-31 ENCOUNTER — Encounter (HOSPITAL_BASED_OUTPATIENT_CLINIC_OR_DEPARTMENT_OTHER): Payer: Self-pay

## 2015-03-31 ENCOUNTER — Emergency Department (HOSPITAL_BASED_OUTPATIENT_CLINIC_OR_DEPARTMENT_OTHER): Payer: Medicare Other

## 2015-03-31 ENCOUNTER — Emergency Department (HOSPITAL_BASED_OUTPATIENT_CLINIC_OR_DEPARTMENT_OTHER)
Admission: EM | Admit: 2015-03-31 | Discharge: 2015-03-31 | Disposition: A | Discharge status: Other Acute Inpatient Hospital | Payer: Medicare Other | Attending: Emergency Medicine | Admitting: Emergency Medicine

## 2015-03-31 DIAGNOSIS — D849 Immunodeficiency, unspecified: Secondary | ICD-10-CM | POA: Diagnosis not present

## 2015-03-31 DIAGNOSIS — F329 Major depressive disorder, single episode, unspecified: Secondary | ICD-10-CM | POA: Insufficient documentation

## 2015-03-31 DIAGNOSIS — Z880 Allergy status to penicillin: Secondary | ICD-10-CM | POA: Insufficient documentation

## 2015-03-31 DIAGNOSIS — J189 Pneumonia, unspecified organism: Secondary | ICD-10-CM

## 2015-03-31 DIAGNOSIS — K219 Gastro-esophageal reflux disease without esophagitis: Secondary | ICD-10-CM | POA: Insufficient documentation

## 2015-03-31 DIAGNOSIS — Z79899 Other long term (current) drug therapy: Secondary | ICD-10-CM | POA: Diagnosis not present

## 2015-03-31 DIAGNOSIS — I10 Essential (primary) hypertension: Secondary | ICD-10-CM | POA: Insufficient documentation

## 2015-03-31 DIAGNOSIS — F17210 Nicotine dependence, cigarettes, uncomplicated: Secondary | ICD-10-CM | POA: Insufficient documentation

## 2015-03-31 DIAGNOSIS — Z7982 Long term (current) use of aspirin: Secondary | ICD-10-CM | POA: Insufficient documentation

## 2015-03-31 DIAGNOSIS — J159 Unspecified bacterial pneumonia: Secondary | ICD-10-CM | POA: Insufficient documentation

## 2015-03-31 DIAGNOSIS — Z8739 Personal history of other diseases of the musculoskeletal system and connective tissue: Secondary | ICD-10-CM | POA: Diagnosis not present

## 2015-03-31 DIAGNOSIS — D899 Disorder involving the immune mechanism, unspecified: Secondary | ICD-10-CM

## 2015-03-31 DIAGNOSIS — R05 Cough: Secondary | ICD-10-CM | POA: Diagnosis present

## 2015-03-31 DIAGNOSIS — N289 Disorder of kidney and ureter, unspecified: Secondary | ICD-10-CM | POA: Diagnosis not present

## 2015-03-31 LAB — COMPREHENSIVE METABOLIC PANEL
ALT: 11 U/L — ABNORMAL LOW (ref 17–63)
ANION GAP: 10 (ref 5–15)
AST: 19 U/L (ref 15–41)
Albumin: 4 g/dL (ref 3.5–5.0)
Alkaline Phosphatase: 111 U/L (ref 38–126)
BUN: 33 mg/dL — ABNORMAL HIGH (ref 6–20)
CHLORIDE: 103 mmol/L (ref 101–111)
CO2: 19 mmol/L — ABNORMAL LOW (ref 22–32)
CREATININE: 2.45 mg/dL — AB (ref 0.61–1.24)
Calcium: 8.9 mg/dL (ref 8.9–10.3)
GFR, EST AFRICAN AMERICAN: 36 mL/min — AB (ref >60)
GFR, EST NON AFRICAN AMERICAN: 31 mL/min — AB (ref >60)
Glucose, Bld: 117 mg/dL — ABNORMAL HIGH (ref 65–99)
POTASSIUM: 4.2 mmol/L (ref 3.5–5.1)
Sodium: 132 mmol/L — ABNORMAL LOW (ref 135–145)
Total Bilirubin: 0.6 mg/dL (ref 0.3–1.2)
Total Protein: 7.2 g/dL (ref 6.5–8.1)

## 2015-03-31 LAB — CBC WITH DIFFERENTIAL/PLATELET
Basophils Absolute: 0 10*3/uL (ref 0.0–0.1)
Basophils Relative: 0 %
EOS PCT: 0 %
Eosinophils Absolute: 0 10*3/uL (ref 0.0–0.7)
HCT: 38.4 % — ABNORMAL LOW (ref 39.0–52.0)
Hemoglobin: 12.4 g/dL — ABNORMAL LOW (ref 13.0–17.0)
LYMPHS ABS: 1.7 10*3/uL (ref 0.7–4.0)
LYMPHS PCT: 10 %
MCH: 25.4 pg — AB (ref 26.0–34.0)
MCHC: 32.3 g/dL (ref 30.0–36.0)
MCV: 78.7 fL (ref 78.0–100.0)
MONO ABS: 1.4 10*3/uL — AB (ref 0.1–1.0)
Monocytes Relative: 9 %
Neutro Abs: 13.2 10*3/uL — ABNORMAL HIGH (ref 1.7–7.7)
Neutrophils Relative %: 81 %
PLATELETS: 192 10*3/uL (ref 150–400)
RBC: 4.88 MIL/uL (ref 4.22–5.81)
RDW: 16.2 % — AB (ref 11.5–15.5)
WBC: 16.3 10*3/uL — ABNORMAL HIGH (ref 4.0–10.5)

## 2015-03-31 MED ORDER — DEXTROSE 5 % IV SOLN
500.0000 mg | Freq: Once | INTRAVENOUS | Status: DC
Start: 2015-03-31 — End: 2015-03-31

## 2015-03-31 MED ORDER — DEXTROSE 5 % IV SOLN: 2.0000 g | Freq: Once | INTRAVENOUS | Status: DC

## 2015-03-31 MED ORDER — ALBUTEROL SULFATE (2.5 MG/3ML) 0.083% IN NEBU
INHALATION_SOLUTION | RESPIRATORY_TRACT | Status: AC
  Administered 2015-03-31: 2.5 mg via RESPIRATORY_TRACT
  Filled 2015-03-31: qty 3

## 2015-03-31 MED ORDER — SODIUM CHLORIDE 0.9 % IV SOLN: INTRAVENOUS | Status: DC

## 2015-03-31 MED ORDER — IPRATROPIUM-ALBUTEROL 0.5-2.5 (3) MG/3ML IN SOLN
3.0000 mL | Freq: Four times a day (QID) | RESPIRATORY_TRACT | Status: DC
Start: 2015-03-31 — End: 2015-03-31
  Administered 2015-03-31: 3 mL via RESPIRATORY_TRACT
  Filled 2015-03-31: qty 3

## 2015-03-31 MED ORDER — IPRATROPIUM-ALBUTEROL 0.5-2.5 (3) MG/3ML IN SOLN
3.0000 mL | Freq: Once | RESPIRATORY_TRACT | Status: AC
  Administered 2015-03-31: 3 mL via RESPIRATORY_TRACT

## 2015-03-31 MED ORDER — ALBUTEROL SULFATE (2.5 MG/3ML) 0.083% IN NEBU
2.5000 mg | INHALATION_SOLUTION | Freq: Once | RESPIRATORY_TRACT | Status: AC
  Administered 2015-03-31: 2.5 mg via RESPIRATORY_TRACT

## 2015-03-31 MED ORDER — AZITHROMYCIN 500 MG IV SOLR
INTRAVENOUS | Status: AC
  Filled 2015-03-31: qty 500

## 2015-03-31 MED ORDER — PREDNISONE 50 MG PO TABS
60.0000 mg | ORAL_TABLET | Freq: Once | ORAL | Status: AC
  Administered 2015-03-31: 60 mg via ORAL
  Filled 2015-03-31: qty 1

## 2015-03-31 MED ORDER — CEFTRIAXONE SODIUM 1 G IJ SOLR
1.0000 g | Freq: Once | INTRAMUSCULAR | Status: AC
  Administered 2015-03-31: 1 g via INTRAVENOUS
  Filled 2015-03-31: qty 10

## 2015-03-31 MED ORDER — ONDANSETRON HCL 4 MG/2ML IJ SOLN
4.0000 mg | Freq: Once | INTRAMUSCULAR | Status: AC
  Administered 2015-03-31: 4 mg via INTRAVENOUS

## 2015-03-31 MED ORDER — SODIUM CHLORIDE 0.9 % IV BOLUS (SEPSIS): 1000.0000 mL | Freq: Once | INTRAVENOUS | Status: DC

## 2015-03-31 MED ORDER — IPRATROPIUM-ALBUTEROL 0.5-2.5 (3) MG/3ML IN SOLN
RESPIRATORY_TRACT | Status: AC
  Administered 2015-03-31: 3 mL via RESPIRATORY_TRACT
  Filled 2015-03-31: qty 3

## 2015-03-31 MED ORDER — ONDANSETRON HCL 4 MG/2ML IJ SOLN
INTRAMUSCULAR | Status: AC
  Filled 2015-03-31: qty 2

## 2015-03-31 NOTE — ED Provider Notes (Signed)
CSN: [id number]     Arrival date & time 03/31/15  1351 History   First MD Initiated Contact with Patient 03/31/15 1445     Chief Complaint  Patient presents with  . Cough     (Consider location/radiation/quality/duration/timing/severity/associated sxs/prior Treatment) HPI [name] is a 40 y.o. 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 Patient states he has been wheezing and coughing up thick sputum. He has been using albuterol which has not been helping. He is on chronic prednisone for his transplant, and reports being diagnosed with bronchitis one month ago which she states improved some. Patient is in every day smoker. He denies any neck pain or stiffness. He denies any nausea vomiting diarrhea. He denies abdominal pain or chest pain. Nothing is making symptoms are worse. He denies any other complaints  Past Medical History  Diagnosis Date  . Osteoporosis   . Renal disorder   . Hypertension   . GERD (gastroesophageal reflux disease)   . Depression   . Renal failure    Past Surgical History  Procedure Laterality Date  . Nephrectomy transplanted organ    . Combined kidney-pancreas transplant     No family history on file. Social History  Substance Use Topics  . Smoking status: Current Every Day Smoker -- 1.00 packs/day    Types: Cigarettes  . Smokeless tobacco: None  . Alcohol Use: No    Review of Systems  Constitutional: Positive for chills. Negative for fever.  HENT: Positive for congestion and sore throat.   Respiratory: Positive for cough, chest tightness, shortness of breath and wheezing.   Cardiovascular: Negative for chest pain, palpitations and leg swelling.  Gastrointestinal: Negative for nausea, vomiting, abdominal pain, diarrhea and abdominal distention.  Genitourinary: Negative for dysuria, urgency, frequency, hematuria and difficulty  urinating.  Musculoskeletal: Positive for myalgias and arthralgias. Negative for neck pain and neck stiffness.  Skin: Negative for rash.  Allergic/Immunologic: Negative for immunocompromised state.  Neurological: Positive for weakness. Negative for dizziness, light-headedness, numbness and headaches.  All other systems reviewed and are negative.     Allergies  Amoxicillin; Penicillins; and Tape  Home Medications   Prior to Admission medications   Medication Sig Start Date End Date Taking? Authorizing Provider  mirtazapine (REMERON) 15 MG tablet Take 15 mg by mouth at bedtime.   Yes Historical Provider, MD  albuterol (PROVENTIL HFA;VENTOLIN HFA) 108 (90 BASE) MCG/ACT inhaler Inhale 1-2 puffs into the lungs every 6 (six) hours as needed for wheezing or shortness of breath. 04/01/14   Tilden Fossa, MD  HYDROcodone-acetaminophen (NORCO) 5-325 MG per tablet Take 1-2 tablets by mouth every 6 (six) hours as needed for severe pain. 10/21/13   Mercedes Camprubi-Soms, PA-C  labetalol (NORMODYNE) 300 MG tablet Take 300 mg by mouth 2 (two) times daily.    Historical Provider, MD  Multiple Vitamin (MULTIVITAMIN WITH MINERALS) TABS Take 1 tablet by mouth daily. Reported on 01/23/2015    Historical Provider, MD  mycophenolate (MYFORTIC) 180 MG EC tablet TAKE 4 TABLETS BY MOUTH TWICE A DAY 01/15/15   Historical Provider, MD  omeprazole (PRILOSEC) 40 MG capsule Take 40 mg by mouth 2 (two) times daily.    Historical Provider, MD  predniSONE (DELTASONE) 5 MG tablet Take 1 tablet (5 mg total) by mouth daily. 01/26/15   Jeanella Craze, NP  sulfamethoxazole-trimethoprim (BACTRIM DS) 800-160 MG per tablet Take 1 tablet by  mouth 2 (two) times a week. On Mon and Thurs    Historical Provider, MD  tacrolimus (PROGRAF) 1 MG capsule Take 3 capsules (3 mg total) by mouth 2 times daily. 01/15/15   Historical Provider, MD   BP 144/85 mmHg  Pulse 88  Temp(Src) 98.5 F (36.9 C) (Oral)  Resp 16  Ht 5\' 6"  (1.676 m)  Wt  53.978 kg  BMI 19.22 kg/m2  SpO2 93% Physical Exam  Constitutional: He is oriented to person, place, and time. He appears well-developed and well-nourished. No distress.  HENT:  Head: Normocephalic and atraumatic.  Right Ear: Tympanic membrane, external ear and ear canal normal.  Left Ear: Tympanic membrane, external ear and ear canal normal.  Nose: Mucosal edema and rhinorrhea present.  Mouth/Throat: Uvula is midline, oropharynx is clear and moist and mucous membranes are normal.  Eyes: Conjunctivae are normal.  Neck: Neck supple.  Cardiovascular: Normal rate, regular rhythm and normal heart sounds.   Pulmonary/Chest: Effort normal. No respiratory distress. He has wheezes. He has no rales. He exhibits no tenderness.  Abdominal: Soft. Bowel sounds are normal. He exhibits no distension. There is no tenderness. There is no rebound.  Musculoskeletal: He exhibits no edema.  Neurological: He is alert and oriented to person, place, and time.  Skin: Skin is warm and dry.  Nursing note and vitals reviewed.   ED Course  Procedures (including critical care time) Labs Review Labs Reviewed  CBC WITH DIFFERENTIAL/PLATELET - Abnormal; Notable for the following:    WBC 16.3 (*)    Hemoglobin 12.4 (*)    HCT 38.4 (*)    MCH 25.4 (*)    RDW 16.2 (*)    Neutro Abs 13.2 (*)    Monocytes Absolute 1.4 (*)    All other components within normal limits  COMPREHENSIVE METABOLIC PANEL    Imaging Review Dg Chest 2 View  03/31/2015  CLINICAL DATA:  Cough congestion and fever for 4 days, patient smokes EXAM: CHEST  2 VIEW COMPARISON:  02/17/2015, 01/23/2015 FINDINGS: Heart size upper normal and stable. Moderate bilateral perihilar bronchitic change. Volume loss medial right middle lobe. No pleural effusion. Stable left arm soft tissue calcification. IMPRESSION: Chronic bronchitic change. Volume loss right middle lobe. This could reflect postobstructive change. Pneumonia not excluded. Followup PA and  lateral chest X-ray is recommended in 3-4 weeks following trial of antibiotic therapy to ensure resolution and exclude underlying malignancy. Electronically Signed   By: Esperanza Heir M.D.   On: 03/31/2015 14:49   I have personally reviewed and evaluated these images and lab results as part of my medical decision-making.   EKG Interpretation None      MDM   Final diagnoses:  CAP (community acquired pneumonia)  Renal insufficiency  Immunocompromised (HCC)     Pt with cough, flu like symptoms for 3 days. Out of the window for tamiflu. VS normal. Does not appear to be septic. Will get CXR and labs. Pt is wheezing, will start duo neb and prednisone 60mg  ordered.   5:04 PM  X-ray showing possible pneumonia. Labs show a white count of 16, elevated creatinine to 2.45, appears to be higher than patient's baseline. Initiated antibiotics and fluids. Will get blood cultures. Discussed with Dr. Anitra Lauth who recommended admission given his immunocompromise status and worsening creatinine. I discussed patient with Dr. Richardson Chiquito at [facility], will accept for transfer.   Filed Vitals:   03/31/15 1355 03/31/15 1408 03/31/15 1524 03/31/15 1620  BP: 144/85   119/75  Pulse:  88   112  Temp: 98.5 F (36.9 C)     TempSrc: Oral     Resp: 16   20  Height:  (1.676 m)     Weight: 53.978 kg     SpO2: 97% 99% 93% 94%     Jaynie Crumble, PA-C 04/02/15 1959  Gwyneth Sprout, MD 04/03/15 0740

## 2015-03-31 NOTE — ED Notes (Signed)
Spoke with wendy, RN on 8N to give report.

## 2015-03-31 NOTE — ED Notes (Signed)
nonprod cough x 3 days-NAD

## 2015-04-05 LAB — CULTURE, BLOOD (ROUTINE X 2)
CULTURE: NO GROWTH
Culture: NO GROWTH

## 2015-09-18 ENCOUNTER — Emergency Department (HOSPITAL_BASED_OUTPATIENT_CLINIC_OR_DEPARTMENT_OTHER): Payer: Medicare Other

## 2015-09-18 ENCOUNTER — Encounter (HOSPITAL_BASED_OUTPATIENT_CLINIC_OR_DEPARTMENT_OTHER): Payer: Self-pay | Admitting: *Deleted

## 2015-09-18 ENCOUNTER — Emergency Department (HOSPITAL_BASED_OUTPATIENT_CLINIC_OR_DEPARTMENT_OTHER)
Admission: EM | Admit: 2015-09-18 | Discharge: 2015-09-18 | Disposition: A | Discharge status: Home / Self Care | Payer: Medicare Other | Attending: Emergency Medicine | Admitting: Emergency Medicine

## 2015-09-18 DIAGNOSIS — E1022 Type 1 diabetes mellitus with diabetic chronic kidney disease: Secondary | ICD-10-CM | POA: Insufficient documentation

## 2015-09-18 DIAGNOSIS — R109 Unspecified abdominal pain: Secondary | ICD-10-CM

## 2015-09-18 DIAGNOSIS — N183 Chronic kidney disease, stage 3 (moderate): Secondary | ICD-10-CM | POA: Diagnosis not present

## 2015-09-18 DIAGNOSIS — K861 Other chronic pancreatitis: Secondary | ICD-10-CM

## 2015-09-18 DIAGNOSIS — Z79899 Other long term (current) drug therapy: Secondary | ICD-10-CM | POA: Diagnosis not present

## 2015-09-18 DIAGNOSIS — I129 Hypertensive chronic kidney disease with stage 1 through stage 4 chronic kidney disease, or unspecified chronic kidney disease: Secondary | ICD-10-CM | POA: Diagnosis not present

## 2015-09-18 DIAGNOSIS — R1032 Left lower quadrant pain: Secondary | ICD-10-CM | POA: Diagnosis present

## 2015-09-18 DIAGNOSIS — F17210 Nicotine dependence, cigarettes, uncomplicated: Secondary | ICD-10-CM | POA: Diagnosis not present

## 2015-09-18 LAB — URINE MICROSCOPIC-ADD ON: RBC / HPF: NONE SEEN RBC/hpf (ref 0–5)

## 2015-09-18 LAB — COMPREHENSIVE METABOLIC PANEL
ALT: 12 U/L — ABNORMAL LOW (ref 17–63)
ANION GAP: 6 (ref 5–15)
AST: 21 U/L (ref 15–41)
Albumin: 3.4 g/dL — ABNORMAL LOW (ref 3.5–5.0)
Alkaline Phosphatase: 105 U/L (ref 38–126)
BILIRUBIN TOTAL: 0.5 mg/dL (ref 0.3–1.2)
BUN: 14 mg/dL (ref 6–20)
CO2: 20 mmol/L — ABNORMAL LOW (ref 22–32)
Calcium: 8.6 mg/dL — ABNORMAL LOW (ref 8.9–10.3)
Chloride: 108 mmol/L (ref 101–111)
Creatinine, Ser: 2.18 mg/dL — ABNORMAL HIGH (ref 0.61–1.24)
GFR calc Af Amer: 42 mL/min — ABNORMAL LOW (ref >60)
GFR, EST NON AFRICAN AMERICAN: 36 mL/min — AB (ref >60)
Glucose, Bld: 166 mg/dL — ABNORMAL HIGH (ref 65–99)
POTASSIUM: 4.2 mmol/L (ref 3.5–5.1)
Sodium: 134 mmol/L — ABNORMAL LOW (ref 135–145)
TOTAL PROTEIN: 6.7 g/dL (ref 6.5–8.1)

## 2015-09-18 LAB — CBC WITH DIFFERENTIAL/PLATELET
BASOS ABS: 0 10*3/uL (ref 0.0–0.1)
Basophils Relative: 0 %
Eosinophils Absolute: 0.3 10*3/uL (ref 0.0–0.7)
Eosinophils Relative: 2 %
HEMATOCRIT: 34 % — AB (ref 39.0–52.0)
Hemoglobin: 11.4 g/dL — ABNORMAL LOW (ref 13.0–17.0)
LYMPHS PCT: 13 %
Lymphs Abs: 1.4 10*3/uL (ref 0.7–4.0)
MCH: 26.8 pg (ref 26.0–34.0)
MCHC: 33.5 g/dL (ref 30.0–36.0)
MCV: 80 fL (ref 78.0–100.0)
Monocytes Absolute: 0.9 10*3/uL (ref 0.1–1.0)
Monocytes Relative: 8 %
NEUTROS ABS: 8.5 10*3/uL — AB (ref 1.7–7.7)
Neutrophils Relative %: 77 %
PLATELETS: 274 10*3/uL (ref 150–400)
RBC: 4.25 MIL/uL (ref 4.22–5.81)
RDW: 15 % (ref 11.5–15.5)
WBC: 11.1 10*3/uL — AB (ref 4.0–10.5)

## 2015-09-18 LAB — URINALYSIS, ROUTINE W REFLEX MICROSCOPIC
Bilirubin Urine: NEGATIVE
GLUCOSE, UA: NEGATIVE mg/dL
Hgb urine dipstick: NEGATIVE
Ketones, ur: NEGATIVE mg/dL
LEUKOCYTES UA: NEGATIVE
Nitrite: NEGATIVE
PROTEIN: 30 mg/dL — AB
Specific Gravity, Urine: 1.015 (ref 1.005–1.030)
pH: 6 (ref 5.0–8.0)

## 2015-09-18 LAB — LIPASE, BLOOD: Lipase: 103 U/L — ABNORMAL HIGH (ref 11–51)

## 2015-09-18 MED ORDER — HYDROCODONE-ACETAMINOPHEN 5-325 MG PO TABS: 1.0000 | ORAL_TABLET | Freq: Four times a day (QID) | ORAL | 0 refills | Status: AC | PRN

## 2015-09-18 MED ORDER — FENTANYL CITRATE (PF) 100 MCG/2ML IJ SOLN
50.0000 ug | Freq: Once | INTRAMUSCULAR | Status: AC
  Administered 2015-09-18: 50 ug via INTRAVENOUS
  Filled 2015-09-18: qty 2

## 2015-09-18 MED ORDER — ONDANSETRON HCL 4 MG/2ML IJ SOLN
4.0000 mg | Freq: Once | INTRAMUSCULAR | Status: AC
  Administered 2015-09-18: 4 mg via INTRAVENOUS
  Filled 2015-09-18: qty 2

## 2015-09-18 NOTE — ED Triage Notes (Signed)
Abdominal pain x 3 weeks. His amylase and lipase were elevated. 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

## 2015-09-18 NOTE — ED Notes (Signed)
Continuous pulse ox placed on pt

## 2015-09-18 NOTE — ED Provider Notes (Signed)
[facility] Provider Note   CSN: [id number] Arrival date & time: 09/18/15  1800  First Provider Contact:   First MD Initiated Contact with Patient 09/18/15 1815      By signing my name below, I, Christy Sartorius, attest that this documentation has been prepared under the direction and in the presence of Nelva Nay, MD . Electronically Signed: Christy Sartorius, Scribe. 09/18/2015. 6:32 PM.   History   Chief Complaint Chief Complaint  Patient presents with  . Abdominal Pain     The history is provided by the patient. No language interpreter was used.     HPI Comments:  [name] is a 40 y.o. male who has PMhx of GERD, HTN, DM, renal disorder and renal failure presents to the Emergency Department complaining of constant, gradual worsening, moderate, non-radiating LLQ abdominal pain onset 3 weeks ago. He reports minor constipation, noting to have a bowel movement every few days. Pt has a past surgical hx of a pancreas and kidney transplant due to having diabetes since he was 42 months old.  He was seen by Dr. Katrinka Blazing at [facility] last week and was told his amylase and lipase levels were elevated which is abnormal. He has an appointment with GI in September and has an US abdomen scheduled next week. He is compliant with his anti-rejection medication. Pt denies dysuria, hematuria, problems with urine production, vomiting, fever, and ETOH consumption.  Past Medical History:  Diagnosis Date  . Depression   . GERD (gastroesophageal reflux disease)   . Hypertension   . Osteoporosis   . Renal disorder   . Renal failure     Patient Active Problem List   Diagnosis Date Noted  . Acute respiratory failure (HCC)   . Major depressive disorder, single episode, moderate (HCC) 01/24/2015  . Malnutrition of moderate degree 01/23/2015  . Respiratory failure (HCC) 01/22/2015  . Intentional drug overdose (HCC) 01/22/2015  . Anemia due to chronic kidney disease  12/22/2010  . Chronic kidney disease (CKD), stage III (moderate) 12/22/2010  . Benign essential HTN 12/22/2010  . H/O kidney transplant 12/22/2010  . Other long term (current) drug therapy 12/22/2010  . History of pancreas transplant (HCC) 12/22/2010  . Type 1 diabetes mellitus (HCC) 12/22/2010    Past Surgical History:  Procedure Laterality Date  . COMBINED KIDNEY-PANCREAS TRANSPLANT    . NEPHRECTOMY TRANSPLANTED ORGAN         Home Medications    Prior to Admission medications   Medication Sig Start Date End Date Taking? Authorizing Provider  albuterol (PROVENTIL HFA;VENTOLIN HFA) 108 (90 BASE) MCG/ACT inhaler Inhale 1-2 puffs into the lungs every 6 (six) hours as needed for wheezing or shortness of breath. 04/01/14  Yes Tilden Fossa, MD  labetalol (NORMODYNE) 300 MG tablet Take 300 mg by mouth 2 (two) times daily.   Yes Historical Provider, MD  mirtazapine (REMERON) 15 MG tablet Take 15 mg by mouth at bedtime.   Yes Historical Provider, MD  Multiple Vitamin (MULTIVITAMIN WITH MINERALS) TABS Take 1 tablet by mouth daily. Reported on 01/23/2015   Yes Historical Provider, MD  mycophenolate (MYFORTIC) 180 MG EC tablet TAKE 4 TABLETS BY MOUTH TWICE A DAY 01/15/15  Yes Historical Provider, MD  omeprazole (PRILOSEC) 40 MG capsule Take 40 mg by mouth 2 (two) times daily.   Yes Historical Provider, MD  predniSONE (DELTASONE) 5 MG tablet Take 1 tablet (5 mg total) by mouth daily. 01/26/15  Yes Jeanella Craze, NP  sulfamethoxazole-trimethoprim (BACTRIM  DS) 800-160 MG per tablet Take 1 tablet by mouth 2 (two) times a week. On Mon and Thurs   Yes Historical Provider, MD  tacrolimus (PROGRAF) 1 MG capsule Take 3 capsules (3 mg total) by mouth 2 times daily. 01/15/15  Yes Historical Provider, MD  HYDROcodone-acetaminophen (NORCO) 5-325 MG tablet Take 1-2 tablets by mouth every 6 (six) hours as needed for severe pain. 09/18/15   Nelva Nay, MD    Family History No family history on  file.  Social History Social History  Substance Use Topics  . Smoking status: Current Every Day Smoker    Packs/day: 1.00    Types: Cigarettes  . Smokeless tobacco: Never Used  . Alcohol use No     Allergies   Amoxicillin; Penicillins; and Tape   Review of Systems Review of Systems  A complete 10 system review of systems was obtained and all systems are negative except as noted in the HPI and PMH.   Physical Exam Updated Vital Signs BP 141/84 (BP Location: Right Arm)   Pulse 71   Temp 98.8 F (37.1 C) (Oral)   Resp 18   Ht  (1.676 m)   Wt 115 lb (52.2 kg)   SpO2 100%   BMI 18.56 kg/m   Physical Exam Physical Exam  Nursing note and vitals reviewed. Constitutional: He is oriented to person, place, and time. He appears well-developed and well-nourished. No distress.  HENT:  Head: Normocephalic and atraumatic.  Eyes: Pupils are equal, round, and reactive to light.  Neck: Normal range of motion.  Cardiovascular: Normal rate and intact distal pulses.   Pulmonary/Chest: No respiratory distress.  Breath sounds equal bilaterally with no wheezes or rales.   Abdominal: Normal appearance. He exhibits no distension.  Patient has mild peri-umbilical tenderness on the left.  No rebound or guarding tenderness noted.  Active bowel sounds.   Musculoskeletal: Normal range of motion.  Neurological: He is alert and oriented to person, place, and time. No cranial nerve deficit.  Skin: Skin is warm and dry. No rash noted.  Psychiatric: He has a normal mood and affect. His behavior is normal.    ED Treatments / Results  Labs (all labs ordered are listed, but only abnormal results are displayed) Labs Reviewed  LIPASE, BLOOD - Abnormal; Notable for the following:       Result Value   Lipase 103 (*)    All other components within normal limits  COMPREHENSIVE METABOLIC PANEL - Abnormal; Notable for the following:    Sodium 134 (*)    CO2 20 (*)    Glucose, Bld 166 (*)     Creatinine, Ser 2.18 (*)    Calcium 8.6 (*)    Albumin 3.4 (*)    ALT 12 (*)    GFR calc non Af Amer 36 (*)    GFR calc Af Amer 42 (*)    All other components within normal limits  CBC WITH DIFFERENTIAL/PLATELET - Abnormal; Notable for the following:    WBC 11.1 (*)    Hemoglobin 11.4 (*)    HCT 34.0 (*)    Neutro Abs 8.5 (*)    All other components within normal limits  URINALYSIS, ROUTINE W REFLEX MICROSCOPIC (NOT AT [facility]) - Abnormal; Notable for the following:    Protein, ur 30 (*)    All other components within normal limits  URINE MICROSCOPIC-ADD ON - Abnormal; Notable for the following:    Squamous Epithelial / LPF 0-5 (*)  Bacteria, UA RARE (*)    All other components within normal limits    EKG  EKG Interpretation None       Radiology Ct Abdomen Pelvis Wo Contrast  Result Date: 09/18/2015 CLINICAL DATA:  Lower abdominal pain, history pancreatitis, pancreatic transplant, renal transplant, hypertension, GERD, smoker, type I diabetes mellitus EXAM: CT ABDOMEN AND PELVIS WITHOUT CONTRAST TECHNIQUE: Multidetector CT imaging of the abdomen and pelvis was performed following the standard protocol without IV contrast. Sagittal and coronal MPR images reconstructed from axial data set. Patient drank dilute oral contrast for exam. COMPARISON:  None FINDINGS: Lower chest: Minimal scarring at anterior lung bases. Mitral annular calcification. Hepatobiliary: Gallbladder and liver normal appearance Pancreas: Location of pancreatic transplant is uncertain. Bowel anastomoses are seen in the LEFT mid abdomen/LEFT upper quadrant though identification of pancreatic tissue is uncertain due to lack of IV contrast. Spleen: Normal appearance Adrenals/Urinary Tract: Unremarkable adrenal glands. Atrophic native kidneys compatible with end-stage renal disease. Transplant kidney RIGHT iliac fossa without hydronephrosis. Small nonobstructing calculus and few renovascular calcifications. Tiny high  attenuation nodule at inferior pole question high attenuation cyst 9 mm diameter. Bladder unremarkable. Stomach/Bowel: Normal appendix. Stomach and bowel loops grossly normal appearance Vascular/Lymphatic: Atherosclerotic calcifications aorta and iliac arteries swallows branch vessels. Aorta normal caliber. No definite adenopathy. Reproductive: N/A Other: No definite free air or free fluid.  No hernia. Musculoskeletal: Diffuse osteosclerosis compatible with renal osteodystrophy. IMPRESSION: Renal osteodystrophy. Location of pancreatic transplant uncertain, unable to assess. Aortic atherosclerosis. Transplant kidney RIGHT iliac fossa with a tiny nonobstructing calculus and questionable 9 mm high attenuation cyst at inferior pole. Electronically Signed   By: Ulyses Southward M.D.   On: 09/18/2015 20:58   Dg Chest 2 View  Result Date: 09/18/2015 CLINICAL DATA:  Abdominal pain for 3 weeks, inflamed pancreas by lab tests, history of pancreatic and renal transplants in about 2005, benign essential hypertension, GERD EXAM: CHEST  2 VIEW COMPARISON:  03/31/2015 FINDINGS: BILATERAL nipple rings. Normal heart size, mediastinal contours, and pulmonary vascularity. Mild chronic peribronchial thickening centrally. Lungs otherwise clear, with improved aeration at RIGHT middle lobe since previous study. No pleural effusion or pneumothorax. No acute osseous findings. Minimal chronic height loss of adjacent lower thoracic vertebra, stable. IMPRESSION: Chronic bronchitic changes without infiltrate. Electronically Signed   By: Ulyses Southward M.D.   On: 09/18/2015 19:11    Procedures Procedures (including critical care time)  DIAGNOSTIC STUDIES:  Oxygen Saturation is 100% on RA, normal by my interpretation.    COORDINATION OF CARE:  6:19 PM Discussed treatment plan with pt at bedside and pt agreed to plan.   Medications Ordered in ED Medications  ondansetron (ZOFRAN) injection 4 mg (4 mg Intravenous Given 09/18/15 1839)   fentaNYL (SUBLIMAZE) injection 50 mcg (50 mcg Intravenous Given 09/18/15 1836)     Initial Impression / Assessment and Plan / ED Course  I have reviewed the triage vital signs and the nursing notes.  Pertinent labs & imaging results that were available during my care of the patient were reviewed by me and considered in my medical decision making (see chart for details).  Clinical Course  Patient did not want to be admitted to the hospital.  I offered to call [facility] for him but he said he would just try some pain medicine and if it got worse he would go to [facility].  Patient was discharged in stable condition.  Told return here or go to [facility] should his pain worsen or should he begin vomiting  or develop fever.  He should contact his transplant doctor ASAP.    Final Clinical Impressions(s) / ED Diagnoses   Final diagnoses:  Chronic pancreatitis, unspecified pancreatitis type [facility](HCC)    New Prescriptions Current Discharge Medication List     I personally performed the services described in this documentation, which was scribed in my presence. The recorded information has been reviewed and considered.    Nelva Nayobert Astraea Gaughran, MD 09/18/15 2122

## 2015-09-18 NOTE — ED Notes (Signed)
Pt returned from CT °

## 2015-09-18 NOTE — ED Notes (Signed)
Patient transported to CT 

## 2015-09-18 NOTE — ED Notes (Signed)
Attempt x 2 to start IV unsuccessful. 

## 2015-09-18 NOTE — ED Notes (Signed)
Pt verbalizes understanding of d/c instructions and denies any further needs at this time. 

## 2015-09-18 NOTE — ED Notes (Signed)
Went in to check on patient and inform him about the wait for his CT.  Due to his renal function and weight, pt has to wait until around 2110 for CT.  Pt in unhappy with the idea of waiting and states he is "ready to go."  He states he is "getting antsy," and asks if he can go smoke a cigarette.  Pt was told that smoking is not allowed on property here and pt states that he will walk out.  Nurse told pt that she would discuss with Dr. Radford PaxBeaton.  Dr.  Radford PaxBeaton going to talk with patient.

## 2017-12-03 IMAGING — DX DG CHEST 2V
2 series · 2 of 2 positions shown · non-contrast
Comparison: 03/31/2015

CLINICAL DATA: Abdominal pain for 3 weeks, inflamed pancreas by lab
tests, history of pancreatic and renal transplants in about 9990,
benign essential hypertension, GERD

EXAM:
CHEST  2 VIEW

[chest pa]
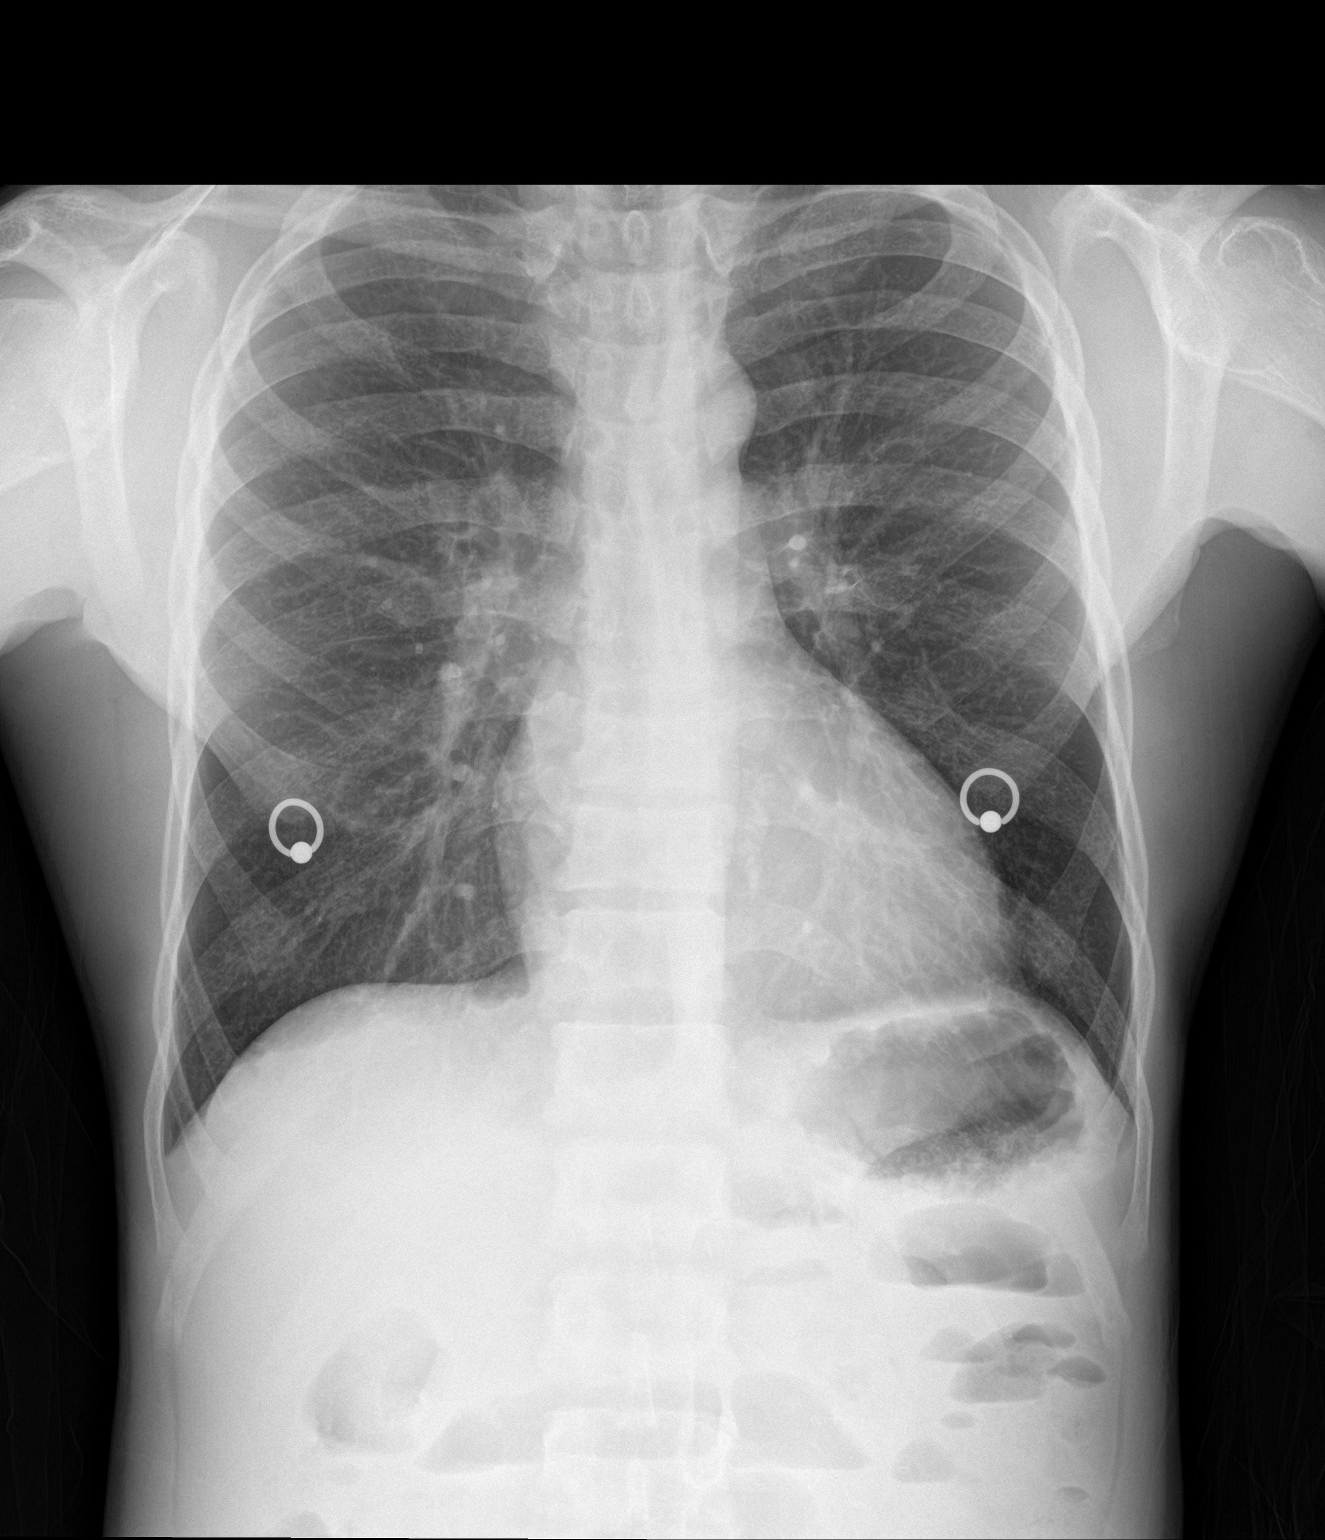

[chest lat]
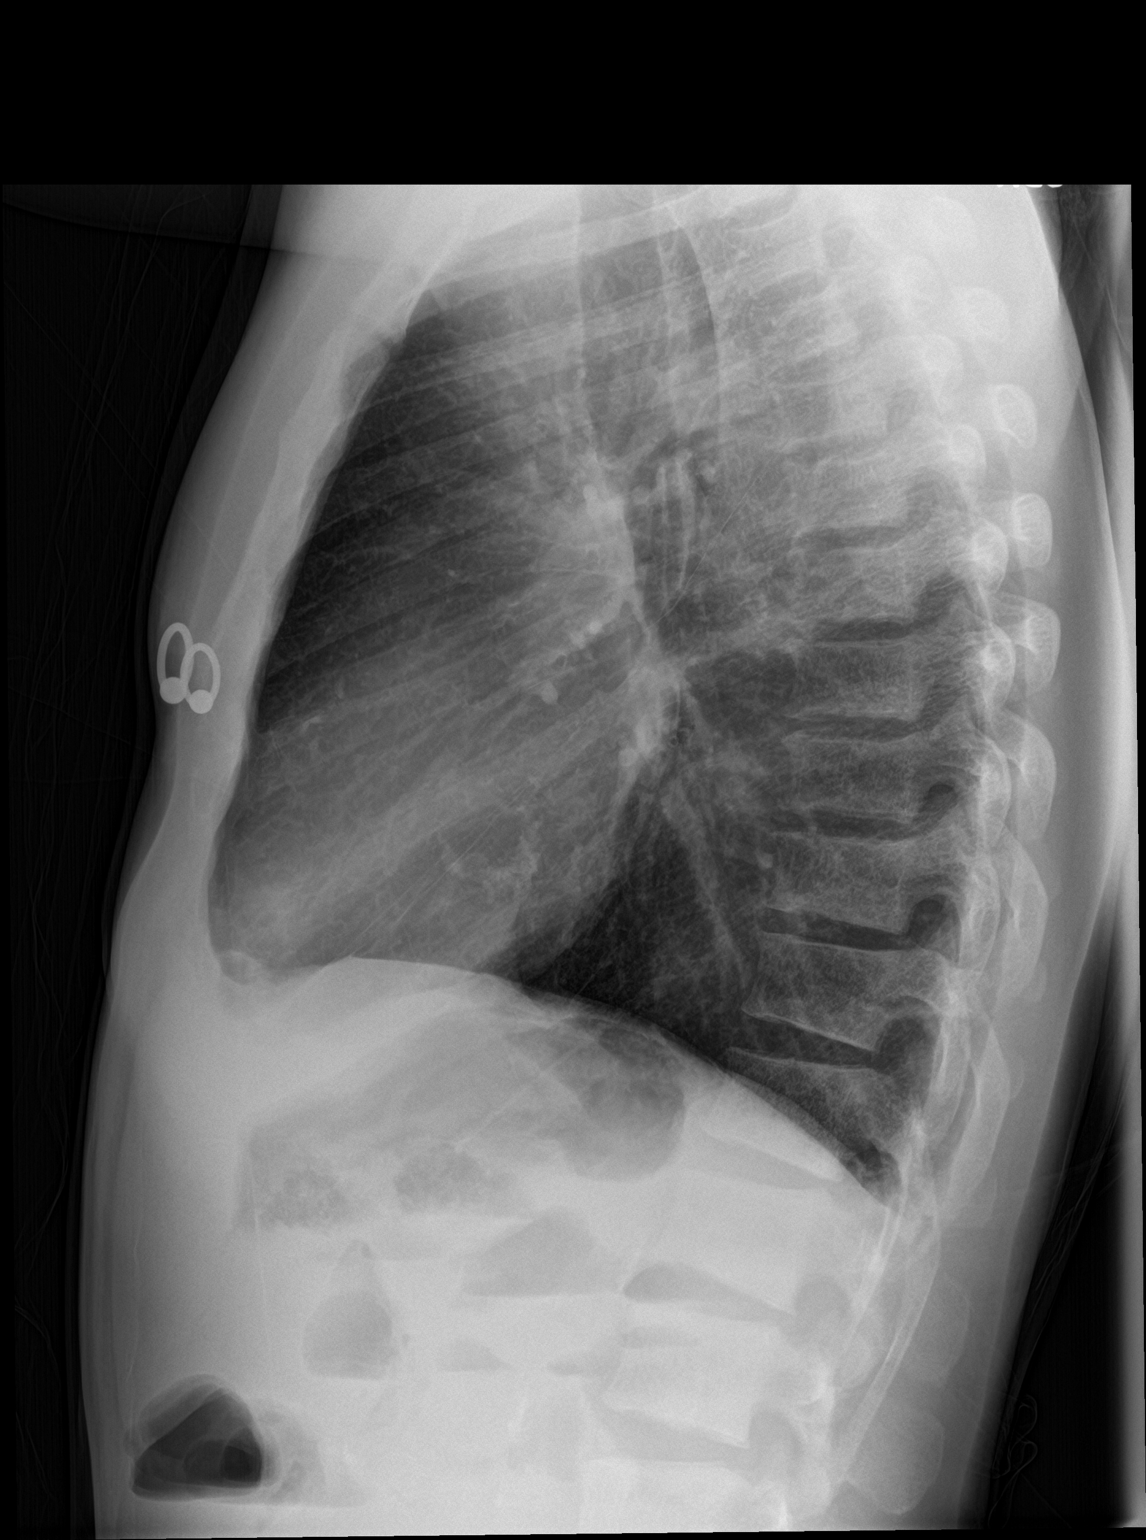

[2 of 2 positions shown; findings below may reference images not displayed]

FINDINGS: BILATERAL nipple rings.

Normal heart size, mediastinal contours, and pulmonary vascularity.

Mild chronic peribronchial thickening centrally.

Lungs otherwise clear, with improved aeration at RIGHT middle lobe
since previous study.

No pleural effusion or pneumothorax.

No acute osseous findings.

Minimal chronic height loss of adjacent lower thoracic vertebra,
stable.
IMPRESSION: Chronic bronchitic changes without infiltrate.

## 2017-12-03 IMAGING — CT CT ABD-PELV W/O CM
2 of 4 series · 16 of 46 positions shown, 18 images · non-contrast
Comparison: None

CLINICAL DATA: Lower abdominal pain, history pancreatitis,
pancreatic transplant, renal transplant, hypertension, GERD, smoker,
type I diabetes mellitus

EXAM:
CT ABDOMEN AND PELVIS WITHOUT CONTRAST
TECHNIQUE: Multidetector CT imaging of the abdomen and pelvis was performed
following the standard protocol without IV contrast. Sagittal and
coronal MPR images reconstructed from axial data set. Patient drank
dilute oral contrast for exam.

[Series 2: axial st · axial · 0.68mm/px · z∈[+412,+802]mm · 13 of 86 slices shown, 15 images]
[im 4/86  soft-tissue]
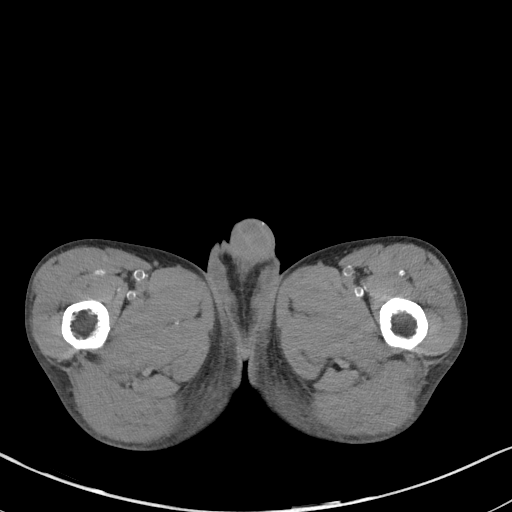
[im 4/86  bone]
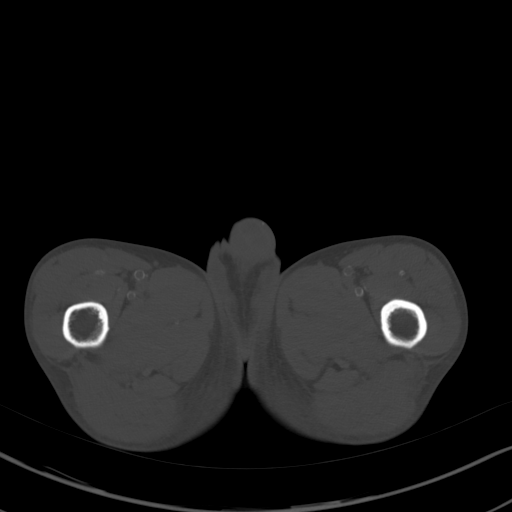
[im 11/86  soft-tissue]
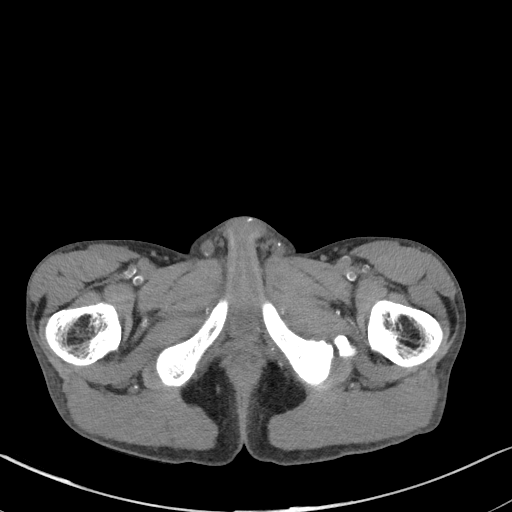
[im 18/86  soft-tissue]
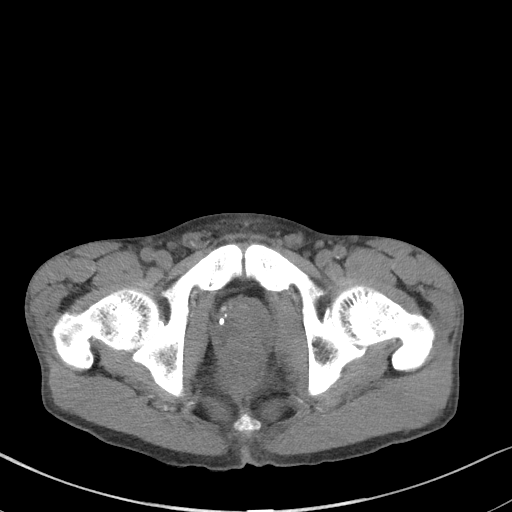
[im 25/86  soft-tissue]
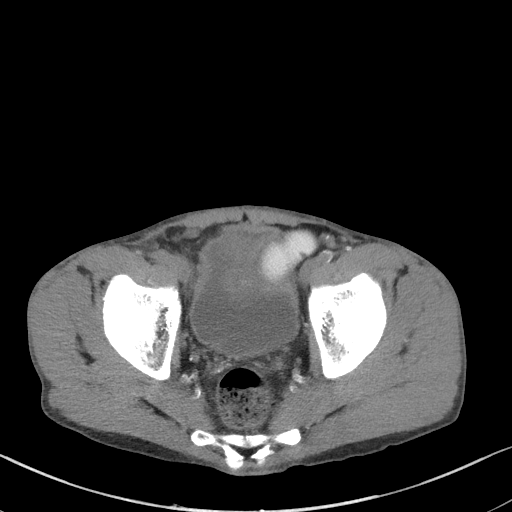
[im 29/86  soft-tissue]
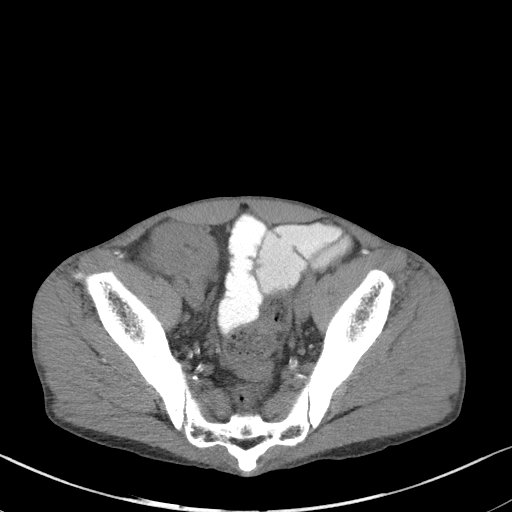
[im 36/86  soft-tissue]
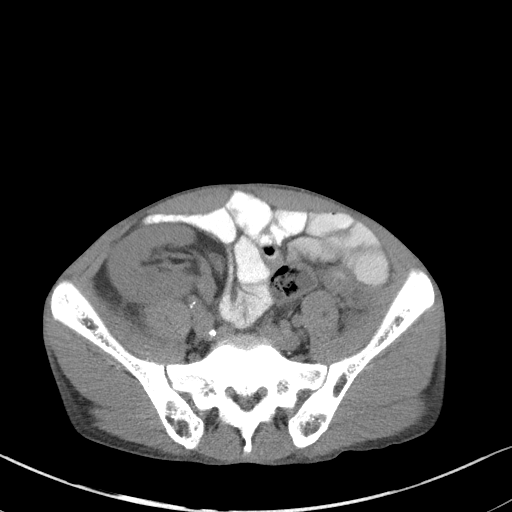
[im 43/86  soft-tissue]
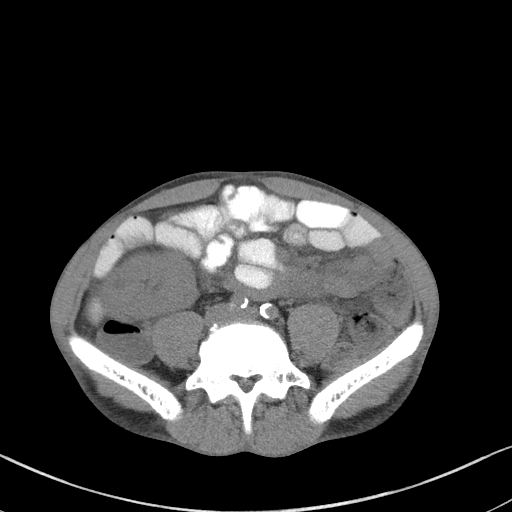
[im 50/86  soft-tissue]
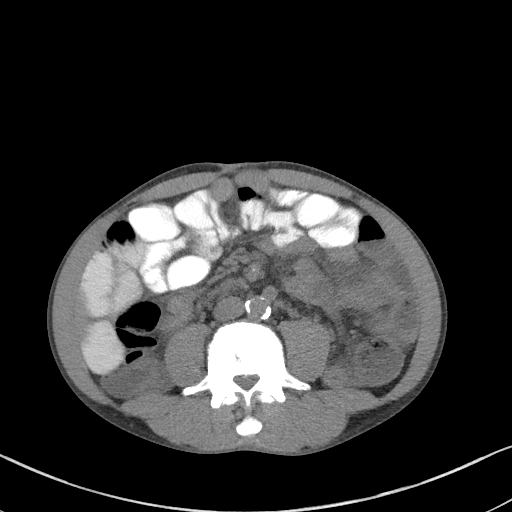
[im 57/86  soft-tissue]
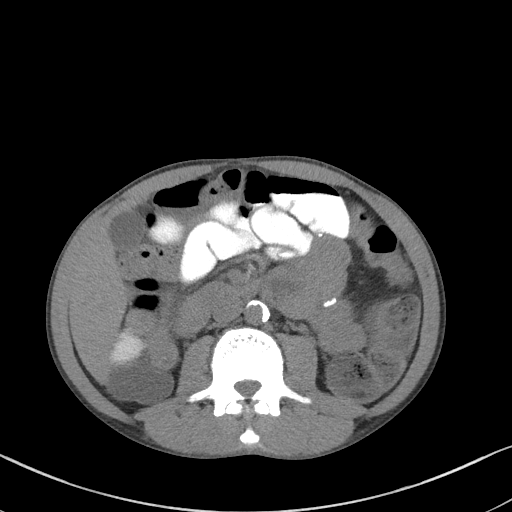
[im 57/86  bone]
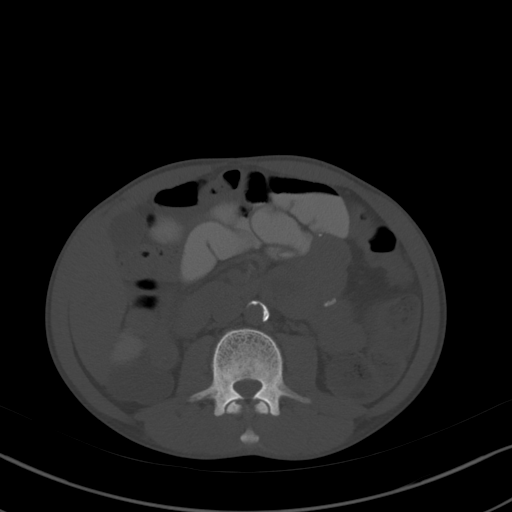
[im 61/86  soft-tissue]
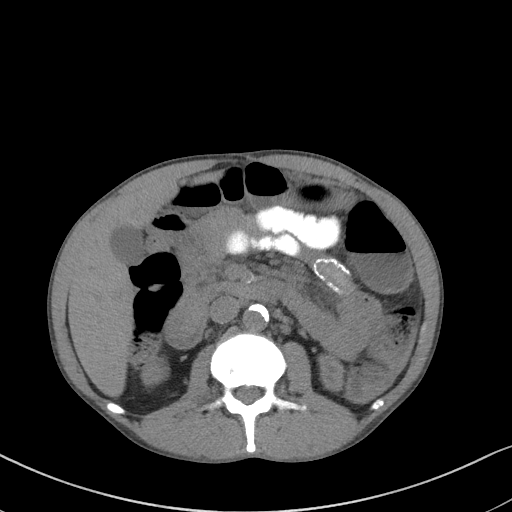
[im 68/86  soft-tissue]
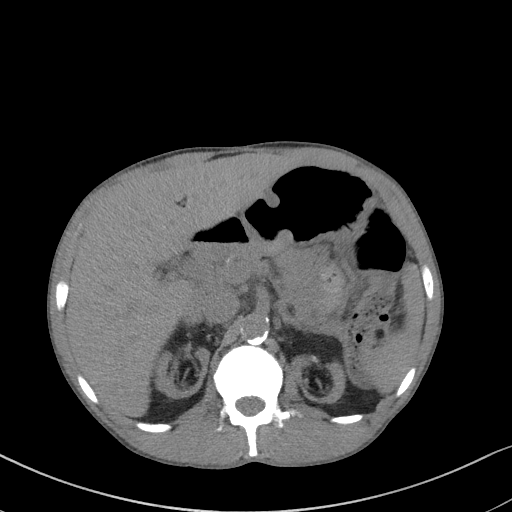
[im 75/86  soft-tissue]
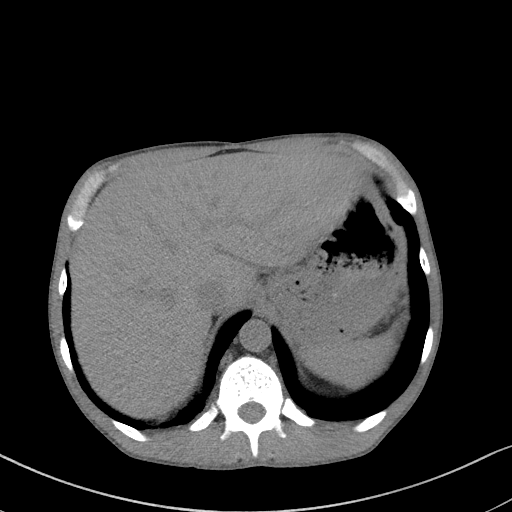
[im 82/86  soft-tissue]
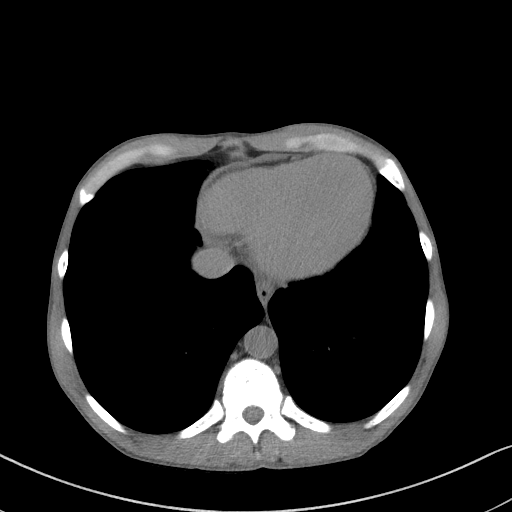

[Series 5: coronal st · coronal · 0.63mm/px · 3 of 77 slices shown]
[im 26/77  soft-tissue]
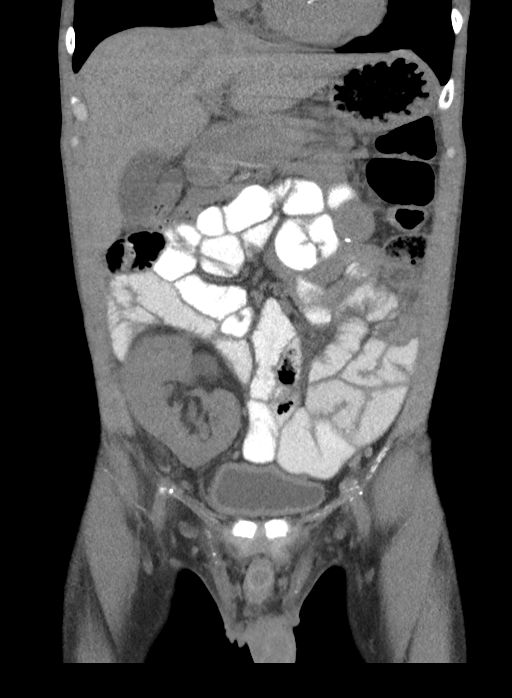
[im 34/77  soft-tissue]
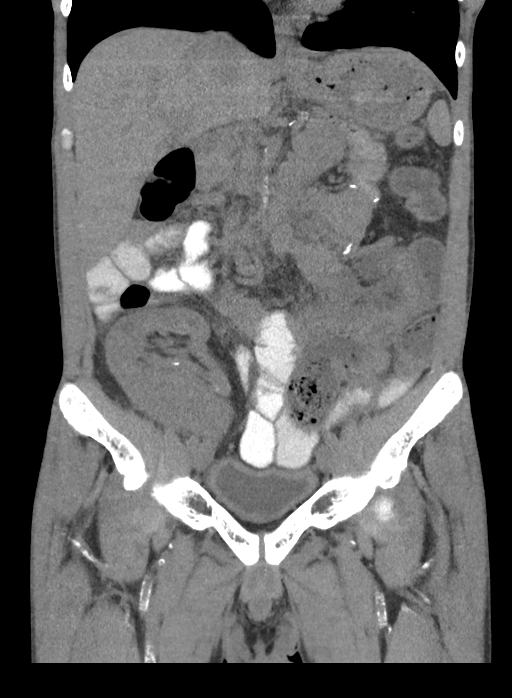
[im 43/77  soft-tissue]
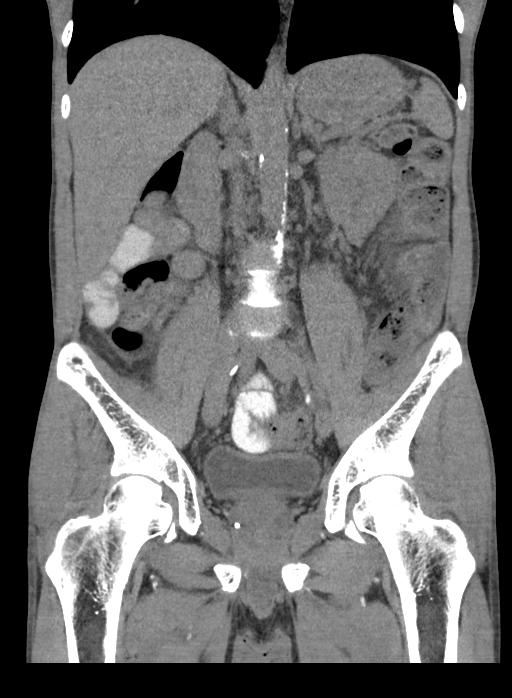

[16 of 46 positions shown; findings below may reference images not displayed]

FINDINGS: Lower chest: Minimal scarring at anterior lung bases. Mitral annular
calcification.

Hepatobiliary: Gallbladder and liver normal appearance

Pancreas: Location of pancreatic transplant is uncertain. Bowel
anastomoses are seen in the LEFT mid abdomen/LEFT upper quadrant
though identification of pancreatic tissue is uncertain due to lack
of IV contrast.

Spleen: Normal appearance

Adrenals/Urinary Tract: Unremarkable adrenal glands. Atrophic native
kidneys compatible with end-stage renal disease. Transplant kidney
RIGHT iliac fossa without hydronephrosis. Small nonobstructing
calculus and few renovascular calcifications. Tiny high attenuation
nodule at inferior pole question high attenuation cyst 9 mm
diameter. Bladder unremarkable.

Stomach/Bowel: Normal appendix. Stomach and bowel loops grossly
normal appearance

Vascular/Lymphatic: Atherosclerotic calcifications aorta and iliac
arteries swallows branch vessels. Aorta normal caliber. No definite
adenopathy.

Reproductive: N/A

Other: No definite free air or free fluid.  No hernia.

Musculoskeletal: Diffuse osteosclerosis compatible with renal
osteodystrophy.
IMPRESSION: Renal osteodystrophy.

Location of pancreatic transplant uncertain, unable to assess.

Aortic atherosclerosis.

Transplant kidney RIGHT iliac fossa with a tiny nonobstructing
calculus and questionable 9 mm high attenuation cyst at inferior
pole.

## 2020-10-08 DEATH — deceased
